# Patient Record
Sex: Male | Born: 1940 | Race: White | Hispanic: No | Marital: Married | Smoking: Current every day smoker
Health system: Southern US, Community
[De-identification: ages and names within clinical notes are randomized; demographics above are authoritative.]

## PROBLEM LIST (undated history)

## (undated) DIAGNOSIS — L309 Dermatitis, unspecified: Secondary | ICD-10-CM

## (undated) DIAGNOSIS — M79605 Pain in left leg: Secondary | ICD-10-CM

## (undated) DIAGNOSIS — F419 Anxiety disorder, unspecified: Secondary | ICD-10-CM

## (undated) DIAGNOSIS — M79604 Pain in right leg: Secondary | ICD-10-CM

## (undated) DIAGNOSIS — R011 Cardiac murmur, unspecified: Secondary | ICD-10-CM

## (undated) DIAGNOSIS — N390 Urinary tract infection, site not specified: Secondary | ICD-10-CM

## (undated) DIAGNOSIS — I10 Essential (primary) hypertension: Secondary | ICD-10-CM

## (undated) DIAGNOSIS — E785 Hyperlipidemia, unspecified: Secondary | ICD-10-CM

## (undated) HISTORY — DX: Pain in left leg: M79.605

## (undated) HISTORY — DX: Cardiac murmur, unspecified: R01.1

## (undated) HISTORY — DX: Dermatitis, unspecified: L30.9

## (undated) HISTORY — DX: Hyperlipidemia, unspecified: E78.5

## (undated) HISTORY — DX: Pain in right leg: M79.604

## (undated) HISTORY — DX: Anxiety disorder, unspecified: F41.9

## (undated) HISTORY — DX: Urinary tract infection, site not specified: N39.0

## (undated) HISTORY — DX: Essential (primary) hypertension: I10

---
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
## 2006-09-04 ENCOUNTER — Emergency Department (HOSPITAL_COMMUNITY): Admission: EM | Admit: 2006-09-04 | Discharge: 2006-09-04 | Payer: Self-pay | Admitting: Family Medicine

## 2007-04-08 ENCOUNTER — Encounter: Payer: Self-pay | Admitting: Internal Medicine

## 2007-10-01 ENCOUNTER — Emergency Department (HOSPITAL_COMMUNITY): Admission: EM | Admit: 2007-10-01 | Discharge: 2007-10-01 | Payer: Self-pay | Admitting: Family Medicine

## 2008-05-15 ENCOUNTER — Ambulatory Visit: Payer: Self-pay | Admitting: Internal Medicine

## 2008-05-15 DIAGNOSIS — I10 Essential (primary) hypertension: Secondary | ICD-10-CM | POA: Insufficient documentation

## 2008-05-15 DIAGNOSIS — L259 Unspecified contact dermatitis, unspecified cause: Secondary | ICD-10-CM

## 2008-05-15 DIAGNOSIS — E785 Hyperlipidemia, unspecified: Secondary | ICD-10-CM | POA: Insufficient documentation

## 2008-05-15 DIAGNOSIS — J309 Allergic rhinitis, unspecified: Secondary | ICD-10-CM | POA: Insufficient documentation

## 2008-06-12 ENCOUNTER — Telehealth: Payer: Self-pay | Admitting: Internal Medicine

## 2008-06-18 ENCOUNTER — Telehealth: Payer: Self-pay | Admitting: Internal Medicine

## 2008-07-23 ENCOUNTER — Ambulatory Visit: Payer: Self-pay | Admitting: Internal Medicine

## 2008-07-23 ENCOUNTER — Telehealth: Payer: Self-pay | Admitting: Internal Medicine

## 2008-10-04 ENCOUNTER — Telehealth: Payer: Self-pay | Admitting: Internal Medicine

## 2009-01-18 ENCOUNTER — Telehealth (INDEPENDENT_AMBULATORY_CARE_PROVIDER_SITE_OTHER): Payer: Self-pay | Admitting: *Deleted

## 2009-03-27 ENCOUNTER — Telehealth (INDEPENDENT_AMBULATORY_CARE_PROVIDER_SITE_OTHER): Payer: Self-pay | Admitting: *Deleted

## 2009-03-28 ENCOUNTER — Ambulatory Visit: Payer: Self-pay | Admitting: Internal Medicine

## 2009-04-23 ENCOUNTER — Telehealth: Payer: Self-pay | Admitting: Internal Medicine

## 2009-10-21 ENCOUNTER — Telehealth: Payer: Self-pay | Admitting: Internal Medicine

## 2009-12-22 HISTORY — PX: COLONOSCOPY: SHX174

## 2010-04-24 ENCOUNTER — Telehealth: Payer: Self-pay | Admitting: Internal Medicine

## 2010-05-22 ENCOUNTER — Telehealth: Payer: Self-pay | Admitting: Internal Medicine

## 2010-06-09 ENCOUNTER — Telehealth: Payer: Self-pay | Admitting: Internal Medicine

## 2010-07-07 ENCOUNTER — Ambulatory Visit: Payer: Self-pay | Admitting: Internal Medicine

## 2010-07-07 DIAGNOSIS — F411 Generalized anxiety disorder: Secondary | ICD-10-CM | POA: Insufficient documentation

## 2010-08-19 ENCOUNTER — Telehealth: Payer: Self-pay | Admitting: Internal Medicine

## 2010-09-23 NOTE — Progress Notes (Signed)
Summary: refill  Phone Note Refill Request Message from:  Pharmacy  Refills Requested: Medication #1:  TEMAZEPAM 15 MG CAPS Take 1 tablet by mouth at bedtime [facility]      Prescriptions: TEMAZEPAM 15 MG CAPS (TEMAZEPAM) Take 1 tablet by mouth at bedtime  #60 x 3   Entered by:   Duard Brady LPN   Authorized by:   Gordy Savers  MD   Signed by:   Duard Brady LPN on 16/05/9603   Method used:   Historical   RxID:   [id number]

## 2010-09-23 NOTE — Progress Notes (Signed)
Summary: refill hydromet  Phone Note Refill Request Message from:  Fax from Pharmacy on April 24, 2010 9:11 AM  Refills Requested: Medication #1:  HYDROCODONE-HOMATROPINE 5-1.5 MG/5ML SYRP 1 teaspoon every 6 hours as needed for cough.   Last Refilled: 12/12/2009 [facility]   [phone]   Method Requested: Telephone to Pharmacy Initial call taken by: Duard Brady LPN,  April 24, 2010 9:11 AM  Follow-up for Phone Call        6 oz Follow-up by: Gordy Savers  MD,  April 24, 2010 12:40 PM    Prescriptions: Romana Juniper 5-1.5 MG/5ML SYRP (HYDROCODONE-HOMATROPINE) 1 teaspoon every 6 hours as needed for cough  #6 ounces x 0   Entered by:   Duard Brady LPN   Authorized by:   Gordy Savers  MD   Signed by:   Duard Brady LPN on 78/46/9629   Method used:   Historical   RxID:   [id number]  [facility]   KIK

## 2010-09-23 NOTE — Progress Notes (Signed)
Summary: refill temazepam   Phone Note Refill Request Message from:  Fax from Pharmacy on June 09, 2010 12:00 PM  Refills Requested: Medication #1:  TEMAZEPAM 15 MG CAPS Take 1 tablet by mouth at bedtime   Last Refilled: 04/07/2010 [facility]      Method Requested: Fax to Local Pharmacy Initial call taken by: Duard Brady LPN,  June 09, 2010 12:01 PM    Prescriptions: TEMAZEPAM 15 MG CAPS (TEMAZEPAM) Take 1 tablet by mouth at bedtime  #15 x 0   Entered by:   Duard Brady LPN   Authorized by:   Gordy Savers  MD   Signed by:   Duard Brady LPN on 16/05/9603   Method used:   Historical   RxID:   [id number]  [facility]ed [facility] to [facility] - MUST BE SEEN _ LAST SEEN 03/2009      KIK

## 2010-09-23 NOTE — Progress Notes (Signed)
Summary: refill hydromet declined  Phone Note Refill Request Message from:  Fax from Pharmacy on May 22, 2010 10:40 AM  Refills Requested: Medication #1:  HYDROCODONE-HOMATROPINE 5-1.5 MG/5ML SYRP 1 teaspoon every 6 hours as needed for cough.   Last Refilled: 04/24/2010 [facility]   Method Requested: Fax to Local Pharmacy Initial call taken by: Duard Brady LPN,  May 22, 2010 10:41 AM  Follow-up for Phone Call        has patient stopped lisinopril; no further Rx for cough med- try OTC delsym; ROV if unimproved Follow-up by: Gordy Savers  MD,  May 22, 2010 2:47 PM  Additional Follow-up for Phone Call Additional follow up Details #1::        spoke with pt - stopped lisinopril awhile ago - using some tpte of 'watermelon coumpound' that he purchased off the internet - seems to be controling BP .  Informed to use OTC delsym - has been using otc - worried about effect on BP - offered appt - declines at this time will let us know if he needs to be seen. KIK Additional Follow-up by: Duard Brady LPN,  May 22, 2010 2:59 PM

## 2010-09-23 NOTE — Assessment & Plan Note (Signed)
Summary: fu on meds/?sinus inf/njr   Vital Signs:  Patient profile:   70 year old male Weight:      204 pounds Temp:     98.0 degrees F oral BP sitting:   170 / 100  (right arm) Cuff size:   regular  Vitals Entered By: Duard Brady LPN (July 07, 2010 3:25 PM) CC: medication review - BP concerns  - sinus infection Is Patient Diabetic? No   CC:  medication review - BP concerns  - sinus infection.  History of Present Illness: 70 year old patient who is followed in the [place] and has been on alprazolam 1 mg t.i.d. since May of this year.  He also had a colonoscopy at that time.  He states that he was placed on a 24-hour blood pressure monitor and his and blood pressure medications have been discontinued.  He continues to monitor home blood pressure readings with normal results in the morning.  Blood pressure readings throughout the day  are  occasionally labile.  He is on simvastatin for dyslipidemia.  He has a history of allergic rhinitis  Allergies (verified): No Known Drug Allergies  Past History:  Past Medical History: Hyperlipidemia Hypertension Allergic rhinitis dermatitis history malaria in 1970 to 1982 Anxiety  Past Surgical History: Tonsillectomy 1950 colonoscopy May 2011  Family History: Reviewed history from 05/15/2008 and no changes required. father died age 80, unclear causes mother died age 5, breast cancer  Two brothers, one died age 49, renal cancer one brother died age 57, brain cancer  Social History: Reviewed history from 05/15/2008 and no changes required. 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  The patient denies anorexia, fever, weight loss, weight gain, vision loss, decreased hearing, chest pain, syncope, dyspnea on exertion, peripheral edema, prolonged  cough, hemoptysis, abdominal pain, melena, hematochezia, severe indigestion/heartburn, hematuria, incontinence, genital sores, muscle weakness, suspicious skin lesions, transient blindness, difficulty walking, depression, unusual weight change, abnormal bleeding, enlarged lymph nodes, angioedema, breast masses, and testicular masses.    Physical Exam  General:  Well-developed,well-nourished,in no acute distress; alert,appropriate and cooperative throughout examination; 140 or 90 Head:  Normocephalic and atraumatic without obvious abnormalities. No apparent alopecia or balding. Eyes:  No corneal or conjunctival inflammation noted. EOMI. Perrla. Funduscopic exam benign, without hemorrhages, exudates or papilledema. Vision grossly normal. Ears:  External ear exam shows no significant lesions or deformities.  Otoscopic examination reveals clear canals, tympanic membranes are intact bilaterally without bulging, retraction, inflammation or discharge. Hearing is grossly normal bilaterally. Nose:  External nasal examination shows no deformity or inflammation. Nasal mucosa are pink and moist without lesions or exudates. Mouth:  Oral mucosa and oropharynx without lesions or exudates.  Teeth in good repair. Neck:  No deformities, masses, or tenderness noted. Chest Wall:  No deformities, masses, tenderness or gynecomastia noted. Lungs:  Normal respiratory effort, chest expands symmetrically. Lungs are clear to auscultation, no crackles or wheezes. Heart:  Normal rate and regular rhythm. S1 and S2 normal without gallop, murmur, click, rub or other extra sounds.   Impression & Recommendations:  Problem # 1:  ALLERGIC RHINITIS (ICD-477.9)  His updated medication list for this problem includes:    Zyrtec Allergy 10 Mg Tabs (Cetirizine hcl) ..... Qd    Fluticasone Propionate 50 Mcg/act Susp (Fluticasone propionate) ..... Use daily  His updated medication  list for this problem includes:    Zyrtec Allergy 10  Mg Tabs (Cetirizine hcl) ..... Qd    Fluticasone Propionate 50 Mcg/act Susp (Fluticasone propionate) ..... Use daily  Problem # 2:  HYPERTENSION (ICD-401.9)  The following medications were removed from the medication list:    Bisoprolol-hydrochlorothiazide 5-6.25 Mg Tabs (Bisoprolol-hydrochlorothiazide) ..... One daily presently  controlled off medications  The following medications were removed from the medication list:    Bisoprolol-hydrochlorothiazide 5-6.25 Mg Tabs (Bisoprolol-hydrochlorothiazide) ..... One daily  Problem # 3:  HYPERLIPIDEMIA (ICD-272.4)  The following medications were removed from the medication list:    Simvastatin 20 Mg Tabs (Simvastatin) .Marland Kitchen... 1 once daily His updated medication list for this problem includes:    Simvastatin 40 Mg Tabs (Simvastatin) ..... One daily  The following medications were removed from the medication list:    Simvastatin 20 Mg Tabs (Simvastatin) .Marland Kitchen... 1 once daily His updated medication list for this problem includes:    Simvastatin 40 Mg Tabs (Simvastatin) ..... One daily  Problem # 4:  ANXIETY (ICD-300.00)  His updated medication list for this problem includes:    Alprazolam 1 Mg Tabs (Alprazolam) .Marland Kitchen... Tid  His updated medication list for this problem includes:    Alprazolam 1 Mg Tabs (Alprazolam) .Marland Kitchen... Tid  Complete Medication List: 1)  Temazepam 15 Mg Caps (Temazepam) .... Take 1 tablet by mouth at bedtime 2)  Hydrocodone-homatropine 5-1.5 Mg/13ml Syrp (Hydrocodone-homatropine) .Marland Kitchen.. 1 teaspoon every 6 hours as needed for cough 3)  Viagra 100 Mg Tabs (Sildenafil citrate) .... As directed 4)  Alprazolam 1 Mg Tabs (Alprazolam) .... Tid 5)  Zyrtec Allergy 10 Mg Tabs (Cetirizine hcl) .... Qd 6)  Aspir-low 81 Mg Tbec (Aspirin) .... Qd 7)  Simvastatin 40 Mg Tabs (Simvastatin) .... One daily 8)  Fluticasone Propionate 50 Mcg/act Susp (Fluticasone propionate) .... Use daily  Patient Instructions: 1)  Please schedule a follow-up  appointment in 6 months. 2)  Limit your Sodium (Salt). 3)  It is important that you exercise regularly at least 20 minutes 5 times a week. If you develop chest pain, have severe difficulty breathing, or feel very tired , stop exercising immediately and seek medical attention. 4)  You need to lose weight. Consider a lower calorie diet and regular exercise.  5)  Check your Blood Pressure regularly. If it is above: 150/90  you should make an appointment. Prescriptions: FLUTICASONE PROPIONATE 50 MCG/ACT SUSP (FLUTICASONE PROPIONATE) use daily  #1 x 6   Entered and Authorized by:   Gordy Savers  MD   Signed by:   Gordy Savers  MD on 07/07/2010   Method used:   Print then Give to Patient   RxID:   [id number] SIMVASTATIN 40 MG TABS (SIMVASTATIN) one daily  #90 x 3   Entered and Authorized by:   Gordy Savers  MD   Signed by:   Gordy Savers  MD on 07/07/2010   Method used:   Print then Give to Patient   RxID:   [id number] ALPRAZOLAM 1 MG TABS (ALPRAZOLAM) tid  #90 x 3   Entered and Authorized by:   Gordy Savers  MD   Signed by:   Gordy Savers  MD on 07/07/2010   Method used:   Print then Give to Patient   RxID:   [id number] VIAGRA 100 MG TABS (SILDENAFIL CITRATE) as directed  #6 x 6   Entered and Authorized by:   Gordy Savers  MD  Signed by:   Gordy Savers  MD on 07/07/2010   Method used:   Print then Give to Patient   RxID:   [id number] HYDROCODONE-HOMATROPINE 5-1.5 MG/5ML SYRP (HYDROCODONE-HOMATROPINE) 1 teaspoon every 6 hours as needed for cough  #6 ounces x 0   Entered and Authorized by:   Gordy Savers  MD   Signed by:   Gordy Savers  MD on 07/07/2010   Method used:   Print then Give to Patient   RxID:   [id number] TEMAZEPAM 15 MG CAPS (TEMAZEPAM) Take 1 tablet by mouth at bedtime  #30 x 3   Entered and Authorized by:   Gordy Savers  MD   Signed by:   Gordy Savers  MD on  07/07/2010   Method used:   Print then Give to Patient   RxID:   [id number]    Orders Added: 1)  Est. Patient Level III [69678]

## 2010-09-25 NOTE — Progress Notes (Signed)
Summary: refill hydromet  Phone Note Refill Request Message from:  Fax from Pharmacy on August 19, 2010 9:26 AM  Refills Requested: Medication #1:  HYDROCODONE-HOMATROPINE 5-1.5 MG/5ML SYRP 1 teaspoon every 6 hours as needed for cough   Last Refilled: 07/08/2010 [facility]   Method Requested: Fax to Local Pharmacy Initial call taken by: Duard Brady LPN,  August 19, 2010 9:27 AM  Follow-up for Phone Call        6 oz Follow-up by: Gordy Savers  MD,  August 19, 2010 12:32 PM    Prescriptions: Romana Juniper 5-1.5 MG/5ML SYRP (HYDROCODONE-HOMATROPINE) 1 teaspoon every 6 hours as needed for cough  #6 ounces x 0   Entered by:   Duard Brady LPN   Authorized by:   Gordy Savers  MD   Signed by:   Duard Brady LPN on 16/05/9603   Method used:   Historical   RxID:   [id number]  faxed back to pharmacy   kik

## 2010-10-17 ENCOUNTER — Other Ambulatory Visit: Payer: Self-pay

## 2010-10-17 MED ORDER — HYDROCODONE-HOMATROPINE 5-1.5 MG/5ML PO SYRP: 5.0000 mL | ORAL_SOLUTION | Freq: Four times a day (QID) | ORAL | Status: AC | PRN

## 2010-10-17 NOTE — Telephone Encounter (Signed)
Faxed back to [facility]. kik

## 2010-10-22 ENCOUNTER — Telehealth: Payer: Self-pay

## 2010-10-22 DIAGNOSIS — G4700 Insomnia, unspecified: Secondary | ICD-10-CM

## 2010-10-22 MED ORDER — TEMAZEPAM 15 MG PO CAPS: 30.0000 mg | ORAL_CAPSULE | Freq: Every evening | ORAL | Status: DC | PRN

## 2010-10-22 NOTE — Telephone Encounter (Signed)
Pt is requesting temazepam rx to read 1 or 2 qhs prn . States he has to occasional take 2 when he feels the 1 qhs has not worked . States he discussed at last office visit. Please advise   [facility]

## 2010-10-22 NOTE — Telephone Encounter (Signed)
Change to med list - order called in to pharmacy. KIK

## 2010-10-22 NOTE — Telephone Encounter (Signed)
Okay if the patient takes one or 2 at bedtime as needed if the prescription is for  15 mg. Maximum dose is 30 mg at bedtime

## 2011-01-12 ENCOUNTER — Other Ambulatory Visit: Payer: Self-pay

## 2011-01-12 MED ORDER — ALPRAZOLAM 1 MG PO TABS: 1.0000 mg | ORAL_TABLET | Freq: Three times a day (TID) | ORAL | Status: DC

## 2011-01-12 NOTE — Telephone Encounter (Signed)
Faxed back to [facility].

## 2011-01-16 ENCOUNTER — Other Ambulatory Visit: Payer: Self-pay

## 2011-01-16 MED ORDER — TEMAZEPAM 15 MG PO CAPS: ORAL_CAPSULE | ORAL | Status: DC

## 2011-01-16 NOTE — Telephone Encounter (Signed)
F[facility]ed [facility] to [facility] 

## 2011-03-26 ENCOUNTER — Other Ambulatory Visit: Payer: Self-pay | Admitting: *Deleted

## 2011-03-26 MED ORDER — TEMAZEPAM 15 MG PO CAPS: ORAL_CAPSULE | ORAL | Status: DC

## 2011-04-13 ENCOUNTER — Other Ambulatory Visit: Payer: Self-pay | Admitting: Internal Medicine

## 2011-08-09 ENCOUNTER — Other Ambulatory Visit: Payer: Self-pay | Admitting: Internal Medicine

## 2011-08-10 ENCOUNTER — Telehealth: Payer: Self-pay

## 2011-08-10 ENCOUNTER — Other Ambulatory Visit: Payer: Self-pay | Admitting: Internal Medicine

## 2011-08-10 NOTE — Telephone Encounter (Signed)
Opened in error

## 2011-09-22 ENCOUNTER — Other Ambulatory Visit: Payer: Self-pay | Admitting: Internal Medicine

## 2011-09-23 NOTE — Telephone Encounter (Signed)
Pt last seen 07/07/2010. Rx last filled 08/10/2011.  Pls advise.

## 2011-09-24 NOTE — Telephone Encounter (Signed)
60

## 2011-09-25 ENCOUNTER — Telehealth: Payer: Self-pay | Admitting: Internal Medicine

## 2011-09-25 NOTE — Telephone Encounter (Signed)
ok 

## 2011-09-25 NOTE — Telephone Encounter (Signed)
Pls advise.  

## 2011-09-25 NOTE — Telephone Encounter (Signed)
Pt has sch ov for 09/30/11 and is req 8 pills to last until ov. Pls call in to [facility] on [address] today prior to leaving office. Pt only has enough med for tonight.

## 2011-09-28 NOTE — Telephone Encounter (Signed)
Rx picked up at pharmacy

## 2011-09-30 ENCOUNTER — Encounter: Payer: Self-pay | Admitting: Internal Medicine

## 2011-10-22 NOTE — Progress Notes (Signed)
This encounter was created in error - please disregard.

## 2011-10-28 ENCOUNTER — Encounter: Payer: Self-pay | Admitting: Family Medicine

## 2011-10-28 ENCOUNTER — Ambulatory Visit (INDEPENDENT_AMBULATORY_CARE_PROVIDER_SITE_OTHER): Payer: Self-pay | Admitting: Family Medicine

## 2011-10-28 DIAGNOSIS — F1010 Alcohol abuse, uncomplicated: Secondary | ICD-10-CM

## 2011-10-28 DIAGNOSIS — I10 Essential (primary) hypertension: Secondary | ICD-10-CM

## 2011-10-28 DIAGNOSIS — F411 Generalized anxiety disorder: Secondary | ICD-10-CM

## 2011-10-28 DIAGNOSIS — F4310 Post-traumatic stress disorder, unspecified: Secondary | ICD-10-CM

## 2011-10-28 DIAGNOSIS — G4700 Insomnia, unspecified: Secondary | ICD-10-CM

## 2011-10-28 DIAGNOSIS — Z720 Tobacco use: Secondary | ICD-10-CM

## 2011-10-28 DIAGNOSIS — E785 Hyperlipidemia, unspecified: Secondary | ICD-10-CM

## 2011-10-28 DIAGNOSIS — F17200 Nicotine dependence, unspecified, uncomplicated: Secondary | ICD-10-CM

## 2011-10-28 MED ORDER — METOPROLOL SUCCINATE ER 50 MG PO TB24: 50.0000 mg | ORAL_TABLET | Freq: Every day | ORAL | Status: DC

## 2011-10-28 NOTE — Patient Instructions (Signed)
It was great to see you today!  Schedule an appointment to see me in one week.  

## 2011-10-29 LAB — COMPREHENSIVE METABOLIC PANEL
ALT: 14 U/L (ref 0–53)
AST: 36 U/L (ref 0–37)
Alkaline Phosphatase: 121 U/L — ABNORMAL HIGH (ref 39–117)
BUN: 10 mg/dL (ref 6–23)
Calcium: 9.2 mg/dL (ref 8.4–10.5)
Chloride: 102 mEq/L (ref 96–112)
Creat: 0.9 mg/dL (ref 0.50–1.35)
Total Bilirubin: 0.5 mg/dL (ref 0.3–1.2)

## 2011-10-29 LAB — LIPID PANEL
HDL: 62 mg/dL (ref >39)
LDL Cholesterol: 129 mg/dL — ABNORMAL HIGH (ref 0–99)

## 2011-10-30 DIAGNOSIS — Z720 Tobacco use: Secondary | ICD-10-CM | POA: Insufficient documentation

## 2011-10-30 DIAGNOSIS — G4700 Insomnia, unspecified: Secondary | ICD-10-CM | POA: Insufficient documentation

## 2011-10-30 DIAGNOSIS — F1010 Alcohol abuse, uncomplicated: Secondary | ICD-10-CM | POA: Insufficient documentation

## 2011-10-30 DIAGNOSIS — F4310 Post-traumatic stress disorder, unspecified: Secondary | ICD-10-CM | POA: Insufficient documentation

## 2011-10-30 NOTE — Assessment & Plan Note (Signed)
Currently using temazepam. I believe this is related to anxiety/alcohol use/poor coping mechanisms.  I will revisit this at his next week appointment

## 2011-10-30 NOTE — Assessment & Plan Note (Signed)
There is more to this than he lets on about. I will slowly peel his denial back and treat this once he is open about it.

## 2011-10-30 NOTE — Assessment & Plan Note (Signed)
This is tied closely to his anxiety, but I do not know how much of this is an excuse for why he is anxious, which likely stems primarily from alcohol addiction and abuse.

## 2011-10-30 NOTE — Assessment & Plan Note (Signed)
I will revisit this at his next week appointment
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
## 2011-10-30 NOTE — Assessment & Plan Note (Signed)
Very elevated pressures. I have started him on Toprol -xl and will do a HTN check in a week. This will help with his adrenergic symptoms as well. I think he will need multiple agents.

## 2011-10-30 NOTE — Assessment & Plan Note (Signed)
Advised to quit. He desires to quit. He is trying electric cigarettes. I will discuss using wellbutrin/nicotine gum/chantix next visit.

## 2011-11-04 ENCOUNTER — Ambulatory Visit (INDEPENDENT_AMBULATORY_CARE_PROVIDER_SITE_OTHER): Payer: Self-pay | Admitting: Family Medicine

## 2011-11-04 ENCOUNTER — Encounter: Payer: Self-pay | Admitting: Family Medicine

## 2011-11-04 VITALS — BP 164/84 | HR 68 | Temp 98.6°F | Ht 70.0 in | Wt 212.5 lb

## 2011-11-04 DIAGNOSIS — F17200 Nicotine dependence, unspecified, uncomplicated: Secondary | ICD-10-CM

## 2011-11-04 DIAGNOSIS — F411 Generalized anxiety disorder: Secondary | ICD-10-CM

## 2011-11-04 DIAGNOSIS — F1010 Alcohol abuse, uncomplicated: Secondary | ICD-10-CM

## 2011-11-04 DIAGNOSIS — G4700 Insomnia, unspecified: Secondary | ICD-10-CM

## 2011-11-04 DIAGNOSIS — Z720 Tobacco use: Secondary | ICD-10-CM

## 2011-11-04 DIAGNOSIS — E785 Hyperlipidemia, unspecified: Secondary | ICD-10-CM

## 2011-11-04 DIAGNOSIS — F4310 Post-traumatic stress disorder, unspecified: Secondary | ICD-10-CM

## 2011-11-04 DIAGNOSIS — I10 Essential (primary) hypertension: Secondary | ICD-10-CM

## 2011-11-04 MED ORDER — ROSUVASTATIN CALCIUM 10 MG PO TABS: 10.0000 mg | ORAL_TABLET | Freq: Every day | ORAL | Status: DC

## 2011-11-04 MED ORDER — HYDROXYZINE HCL 10 MG PO TABS: 10.0000 mg | ORAL_TABLET | Freq: Every evening | ORAL | Status: AC | PRN

## 2011-11-04 MED ORDER — VENLAFAXINE HCL ER 37.5 MG PO CP24: 37.5000 mg | ORAL_CAPSULE | Freq: Every day | ORAL | Status: DC

## 2011-11-04 MED ORDER — AMLODIPINE BESYLATE 10 MG PO TABS: 10.0000 mg | ORAL_TABLET | Freq: Every day | ORAL | Status: DC

## 2011-11-04 NOTE — Patient Instructions (Addendum)
It was great to see you today!  Schedule an appointment to see me in one week.  I have started a new blood pressure medication: amlodipine. Continue to chart your blood pressures, that helps a lot.  I started you on hydroxyzine 10 mg, you can take two tabs for sleep.  I have stopped your temazepam and alprazolam.  The amount of alcohol you drink is safe and healthy.   I have prescribed you Venlafaxine 37 mg daily and we will increase this in one week. This is for your PTSD.  I think a therapist would help you, but this is up to you of course.  See you then.

## 2011-11-05 NOTE — Assessment & Plan Note (Signed)
I have added Amlodipine to his medications. His blood pressure is improving since starting metoprolol. No side effects.

## 2011-11-05 NOTE — Assessment & Plan Note (Signed)
Advised to quit. Says he has cut down to four cigarettes per day and he will continue to cut down. Rec. Gum/patch.

## 2011-11-05 NOTE — Progress Notes (Signed)
  Subjective:    Patient ID: Eric Hoffman, male    DOB: 19-May-1941, 71 y.o.   MRN: [id number]  HPI NEW PATIENT 1. HTN BP Readings from Last 3 Encounters:  11/04/11 164/84  10/28/11 206/99  07/07/10 170/100  His pressure is improving since adding Metoprolol.   Home BP Monitoring brought a well written log showing lower averages than at [facility] 140/90. Chest pain- no     Dyspnea-  no  Medications Compliance: taking as prescribed. Lightheadedness-  no  Edema-  no  No headache, or visual changes. No palpitations.  Lab Review   Potassium  Date Value Range Status  10/28/2011 4.7  3.5-5.3 (mEq/L) Final     Sodium  Date Value Range Status  10/28/2011 140  135-145 (mEq/L) Final    2. SLEEP Has been using temazepam with little success. He has stopped taking xanax during the day.   3. Lipid profile - side effects with simvastatin 40 mg.  Suffering from muscle pains and cramps.   Lab Results  Component Value Date   CHOL 210* 10/28/2011   Lab Results  Component Value Date   HDL 62 10/28/2011   Lab Results  Component Value Date   LDLCALC 129* 10/28/2011   Lab Results  Component Value Date   TRIG 96 10/28/2011   Lab Results  Component Value Date   CHOLHDL 3.4 10/28/2011   No results found for this basename: LDLDIRECT   4. Anxiety/PTSD He has had many traumatic events as reviewed in last visits note. We did anxiety inventory/mood disorder questionnaire/PTSD questionnaire. He scored moderate on anxiety, low-moderate on mood, and very high on PTSD.  He says he was in a psychiatric institute in [place] for 7 weeks many years ago, was fuzzy on the details of diagnosis and treatment.  He denies suicidal/homicial thoughts. No overt mania, although in my office he can appear manic especially when accompanied by his wife. Today he is calm.   6. Alcohol consumption CAGE questions negative - I felt that he may be in denial vs. Purposefully hiding his alcohol use level. His wife who is a recovering  alcoholic gave me a letter stating she is worried about his level of alcohol use. On further review of his alcohol consumption he states he drinks 2 glasses of wine a day with meals. He says he can go without drinking. He never drinks excessively. He does not drink liquor or beer. Never drinks in the morning. Never drives intoxicated.   Review of Systems Pertinent items are noted in HPI. No fever, chills, night sweats, weight loss.     Objective:   Physical Exam Filed Vitals:   11/04/11 1455  BP: 164/84  Pulse: 68  Temp: 98.6 F (37 C)  TempSrc: Oral  Height: 5\' 10"  (1.778 m)  Weight: 212 lb 8 oz (96.389 kg)  General: C/m, calm, coherent, nad, pleasant.  Psych:  Cognition and judgment appear intact. Alert, communicative  and cooperative with normal attention span and concentration. No apparent delusions, illusions, hallucinations    Assessment & Plan:

## 2011-11-05 NOTE — Assessment & Plan Note (Signed)
Side effects to simvastatin 40 mg.  His LDL is not at goal either. I want him less than 100 at least, 70 ideal. His HDL is excellent 62, likely 2nd to fish consumption/fish oil supplements.

## 2011-11-05 NOTE — Assessment & Plan Note (Signed)
His anxiety inventory was moderate and I feel this is related to PTSD, GAD, or underlying mood disorder. I have started venlafaxine and removed benzodiazepines. He denies substance abuse.

## 2011-11-05 NOTE — Assessment & Plan Note (Signed)
Hydroxyzine 10 mg prn insomnia. This is likely related to his anxiety state.

## 2011-11-05 NOTE — Assessment & Plan Note (Signed)
Denies alcohol abuse again. He is drinking two glasses of wine a day, which is not abuse.

## 2011-11-05 NOTE — Assessment & Plan Note (Signed)
Scored high on the PTSD assessment. I have started Venlafaxine 37.2 daily, will see again in a week to evaluate. I advised him that this could potentiate mania - I will keep a close eye on this next visit.

## 2011-11-18 ENCOUNTER — Ambulatory Visit (INDEPENDENT_AMBULATORY_CARE_PROVIDER_SITE_OTHER): Payer: Self-pay | Admitting: Family Medicine

## 2011-11-18 ENCOUNTER — Encounter: Payer: Self-pay | Admitting: Family Medicine

## 2011-11-18 VITALS — BP 170/102 | HR 88 | Temp 99.1°F | Ht 70.0 in | Wt 207.0 lb

## 2011-11-18 DIAGNOSIS — F411 Generalized anxiety disorder: Secondary | ICD-10-CM

## 2011-11-18 DIAGNOSIS — I10 Essential (primary) hypertension: Secondary | ICD-10-CM

## 2011-11-18 MED ORDER — SIMVASTATIN 40 MG PO TABS: 40.0000 mg | ORAL_TABLET | Freq: Every evening | ORAL | Status: DC

## 2011-11-18 MED ORDER — LOSARTAN POTASSIUM 25 MG PO TABS: 50.0000 mg | ORAL_TABLET | Freq: Every day | ORAL | Status: DC

## 2011-11-18 NOTE — Progress Notes (Signed)
Addended by: Edd Arbour on: 11/18/2011 02:31 PM   Modules accepted: Orders

## 2011-11-18 NOTE — Progress Notes (Signed)
  Subjective:    Patient ID: Eric Hoffman, male    DOB: 1940-10-14, 71 y.o.   MRN: [id number]  Hypertension   1. HTN BP Readings from Last 3 Encounters:  11/18/11 170/102  11/04/11 164/84  10/28/11 206/99  His pressure is improving since adding Metoprolol.  He could not tolerate the Amlodipine, but this reaction may have been the effexor.   Home BP Monitoring brought a well written log showing lower averages than at [facility] 140/90. Chest pain- no     Dyspnea-  no  Medications Compliance: taking as prescribed. Lightheadedness-  no  Edema-  no  No headache, or visual changes. No palpitations.  Lab Review   Potassium  Date Value Range Status  10/28/2011 4.7  3.5-5.3 (mEq/L) Final     Sodium  Date Value Range Status  10/28/2011 140  135-145 (mEq/L) Final    4. Anxiety/PTSD Attempted to start effexor, but he had nightmares, diarrhea, throat closing, tremors. He does not want to take this medication.  He denies suicidal/homicial thoughts. No overt mania, although in my office he can appear manic especially when accompanied by his wife. Today he is calm.  He says he is under a lot of stress because his wife has binged recently and may be back in the hospital soon.   Review of Systems Pertinent items are noted in HPI.     Objective:   Physical Exam Filed Vitals:   11/18/11 1342  BP: 170/102  Pulse: 88  Temp: 99.1 F (37.3 C)  TempSrc: Oral  Height: 5\' 10"  (1.778 m)  Weight: 207 lb (93.895 kg)  General: C/m, calm, coherent, nad, pleasant.  Psych:  Cognition and judgment appear intact. Alert, communicative  and cooperative with normal attention span and concentration. No apparent delusions, illusions, hallucinations    Assessment & Plan:

## 2011-11-18 NOTE — Assessment & Plan Note (Addendum)
Still elevated today. He was not able to take the amlodipine 5 mg because of side effects.  I will have him try to take the amlodipine again, if he still has side effects he will pick up losartan below.  I will try Losartan 50 mg daily to see if this will help. Of note his home recordings are averaging 140/90. When he is here at the office he gets nervous.

## 2011-11-18 NOTE — Assessment & Plan Note (Signed)
The patient stopped taking effexor because of side effects. I will hold off on treating his anxiety until his BP is controlled. We want to introduce one medication at a time.  I do not want to use benzos in this male.

## 2011-11-18 NOTE — Patient Instructions (Addendum)
It was great to see you today!  Schedule an appointment to see me in one week.  I want you to try and take the Amlodipine 5 mg again, I believe your reaction came from the effexor which we have stopped.  Continue taking metoprolol 50 mg XL daily.  I have sent in Losartan 50 mg daily, take this if you cannot tolerate the amlodipine.

## 2011-11-25 ENCOUNTER — Ambulatory Visit (INDEPENDENT_AMBULATORY_CARE_PROVIDER_SITE_OTHER): Payer: Self-pay | Admitting: Family Medicine

## 2011-11-25 ENCOUNTER — Encounter: Payer: Self-pay | Admitting: Family Medicine

## 2011-11-25 VITALS — BP 130/80 | HR 93 | Temp 97.8°F | Ht 70.0 in | Wt 203.4 lb

## 2011-11-25 DIAGNOSIS — I10 Essential (primary) hypertension: Secondary | ICD-10-CM

## 2011-11-25 DIAGNOSIS — G4700 Insomnia, unspecified: Secondary | ICD-10-CM

## 2011-11-25 MED ORDER — TEMAZEPAM 15 MG PO CAPS: 15.0000 mg | ORAL_CAPSULE | Freq: Every evening | ORAL | Status: DC | PRN

## 2011-11-25 NOTE — Assessment & Plan Note (Signed)
Doing well on current regimen. Home monitoring shows average of 140/80.

## 2011-11-25 NOTE — Assessment & Plan Note (Signed)
Will start temazepam. Had allergic reaction to atarax, and benadryl.

## 2011-11-25 NOTE — Progress Notes (Signed)
  Subjective:    Patient ID: Eric Hoffman, male    DOB: 02/12/1941, 71 y.o.   MRN: [id number]  Hypertension   1. HTN BP Readings from Last 3 Encounters:  11/25/11 130/80  11/18/11 170/102  11/04/11 164/84  His pressure is improved today.   Home BP Monitoring brought a well written log showing lower averages than at [facility] 140/90. Chest pain- no     Dyspnea-  no  Medications Compliance: taking as prescribed. Lightheadedness-  no  Edema-  no  No headache, or visual changes. No palpitations.  Lab Review   Potassium  Date Value Range Status  10/28/2011 4.7  3.5-5.3 (mEq/L) Final     Sodium  Date Value Range Status  10/28/2011 140  135-145 (mEq/L) Final    2. Insomnia Patient did well on temazepam. He has good sleep hygiene. He has difficulty falling asleep. No respiratory or GERD symptoms. Denies excess alcohol usage.  Review of Systems Pertinent items are noted in HPI.     Objective:   Physical Exam Filed Vitals:   11/25/11 1340  BP: 130/80  Pulse: 93  Temp: 97.8 F (36.6 C)  TempSrc: Oral  Height: 5\' 10"  (1.778 m)  Weight: 203 lb 6.4 oz (92.262 kg)  General: C/m, calm, coherent, nad, pleasant.  Psych:  Cognition and judgment appear intact. Alert, communicative  and cooperative with normal attention span and concentration. No apparent delusions, illusions, hallucinations    Assessment & Plan:

## 2011-11-25 NOTE — Patient Instructions (Signed)
It was great to see you today!  Schedule an appointment to see me as needed.  I have prescribed you temazepam with 5 refills for sleep.  I am glad your blood pressure is well controlled now on these medications.  It was nice to see you today, if you ever want to address your anxiety either with therapy or medically, let me know.

## 2011-12-23 ENCOUNTER — Telehealth: Payer: Self-pay | Admitting: Family Medicine

## 2011-12-23 DIAGNOSIS — G4700 Insomnia, unspecified: Secondary | ICD-10-CM

## 2011-12-23 NOTE — Telephone Encounter (Signed)
Is asking to increase tomazapam to 2 pills per night - sometimes he needs more that other nights and is asking to increase to 2 pills [facility] [address]

## 2011-12-28 MED ORDER — TEMAZEPAM 15 MG PO CAPS: 30.0000 mg | ORAL_CAPSULE | Freq: Every evening | ORAL | Status: DC | PRN

## 2011-12-28 NOTE — Telephone Encounter (Signed)
Called in refill  

## 2011-12-28 NOTE — Telephone Encounter (Signed)
Called and informed patient of below.

## 2012-01-12 ENCOUNTER — Other Ambulatory Visit: Payer: Self-pay | Admitting: Family Medicine

## 2012-01-12 MED ORDER — SILDENAFIL CITRATE 100 MG PO TABS: 100.0000 mg | ORAL_TABLET | ORAL | Status: DC

## 2012-01-12 NOTE — Telephone Encounter (Signed)
Message forwarded to Dr. Rivka Safer.

## 2012-01-12 NOTE — Telephone Encounter (Signed)
Forgot to tell Dr Rivka Safer that he needed a refill on his Viagra  [facility]- [address]

## 2012-01-13 ENCOUNTER — Other Ambulatory Visit: Payer: Self-pay | Admitting: Family Medicine

## 2012-01-13 MED ORDER — LOSARTAN POTASSIUM 25 MG PO TABS: 50.0000 mg | ORAL_TABLET | Freq: Every day | ORAL | Status: DC

## 2012-03-11 ENCOUNTER — Encounter: Payer: Self-pay | Admitting: Family Medicine

## 2012-03-11 ENCOUNTER — Ambulatory Visit (INDEPENDENT_AMBULATORY_CARE_PROVIDER_SITE_OTHER): Payer: Self-pay | Admitting: Family Medicine

## 2012-03-11 VITALS — BP 185/99 | HR 86 | Temp 98.0°F | Ht 70.0 in | Wt 211.0 lb

## 2012-03-11 DIAGNOSIS — R300 Dysuria: Secondary | ICD-10-CM | POA: Insufficient documentation

## 2012-03-11 DIAGNOSIS — I10 Essential (primary) hypertension: Secondary | ICD-10-CM

## 2012-03-11 LAB — POCT URINALYSIS DIPSTICK
Bilirubin, UA: NEGATIVE
Glucose, UA: NEGATIVE
Leukocytes, UA: NEGATIVE
Nitrite, UA: NEGATIVE
Urobilinogen, UA: 0.2

## 2012-03-11 MED ORDER — SULFAMETHOXAZOLE-TRIMETHOPRIM 800-160 MG PO TABS: 1.0000 | ORAL_TABLET | Freq: Two times a day (BID) | ORAL | Status: AC

## 2012-03-11 NOTE — Assessment & Plan Note (Signed)
Significant difference in home and office readings.  Advised may consider bringing home monitors home to compare to office readings at next visit.

## 2012-03-11 NOTE — Patient Instructions (Addendum)
Come back for full testing and exam if not resolved

## 2012-03-11 NOTE — Progress Notes (Signed)
  Subjective:    Patient ID: Eric Hoffman, male    DOB: August 08, 1941, 71 y.o.   MRN: [id number]  HPI Has occasional uti 1-2 per year.  States usually treated with course of septra in [place]. 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  Has nto has fever or other symptoms or malaria relapse this time.  2 weeks of odorous urine, dysuria.  No hesitance or stopping.  No urinary frequency, hesitancy or incontinence.  Took azo for 2 days, felt better.  4-5 days later returned.  Took azo for 3 more days, stopped for the past two days, and now dysuria starting to return.  No abdominal or pelvic pain.   No penile discharge or lesions.  No new sex partners  Hypertension  Elevated today.  Brings lots stating he check with 2 monitors several times daily at home and averages 100-130's. Review of Systems See HPI    Objective:   Physical Exam GEN: Alert & Oriented, No acute distress  declines  exam.        Assessment & Plan:

## 2012-03-11 NOTE — Assessment & Plan Note (Signed)
No evidence of cystitis on u/a.  Clinical symptoms not worrisome for prostatitis.  Will treat empirically with bactrim. Strongly encouraged him to return if does not improve with treatment

## 2012-05-02 ENCOUNTER — Other Ambulatory Visit: Payer: Self-pay | Admitting: Family Medicine

## 2012-05-05 ENCOUNTER — Ambulatory Visit: Payer: Self-pay | Admitting: Family Medicine

## 2012-05-05 ENCOUNTER — Telehealth: Payer: Self-pay | Admitting: Sports Medicine

## 2012-05-05 NOTE — Telephone Encounter (Signed)
Pt called and is req to re-est with Dr Amador Cunas. [place] Pt is having problems with bp med. And is req to come in to see Dr Amador Cunas asap. Pls advise.

## 2012-05-05 NOTE — Telephone Encounter (Signed)
Please advise ok since it looks like he has been seen by others recently

## 2012-05-13 NOTE — Telephone Encounter (Signed)
Please contact pt and schedule appt.

## 2012-05-13 NOTE — Telephone Encounter (Signed)
ok 

## 2012-05-16 ENCOUNTER — Ambulatory Visit (INDEPENDENT_AMBULATORY_CARE_PROVIDER_SITE_OTHER): Payer: Self-pay | Admitting: Internal Medicine

## 2012-05-16 ENCOUNTER — Encounter: Payer: Self-pay | Admitting: Internal Medicine

## 2012-05-16 VITALS — BP 190/100 | Temp 98.0°F | Ht 70.5 in | Wt 214.0 lb

## 2012-05-16 DIAGNOSIS — F411 Generalized anxiety disorder: Secondary | ICD-10-CM

## 2012-05-16 DIAGNOSIS — E785 Hyperlipidemia, unspecified: Secondary | ICD-10-CM

## 2012-05-16 DIAGNOSIS — I10 Essential (primary) hypertension: Secondary | ICD-10-CM

## 2012-05-16 DIAGNOSIS — Z23 Encounter for immunization: Secondary | ICD-10-CM

## 2012-05-16 MED ORDER — LOSARTAN POTASSIUM 100 MG PO TABS: 100.0000 mg | ORAL_TABLET | Freq: Every day | ORAL | Status: DC

## 2012-05-16 MED ORDER — TEMAZEPAM 15 MG PO CAPS: 15.0000 mg | ORAL_CAPSULE | Freq: Every evening | ORAL | Status: DC | PRN

## 2012-05-16 MED ORDER — ALPRAZOLAM 0.5 MG PO TABS: 0.5000 mg | ORAL_TABLET | Freq: Three times a day (TID) | ORAL | Status: DC | PRN

## 2012-05-16 MED ORDER — SILDENAFIL CITRATE 100 MG PO TABS: 100.0000 mg | ORAL_TABLET | ORAL | Status: DC

## 2012-05-16 MED ORDER — SIMVASTATIN 40 MG PO TABS: 40.0000 mg | ORAL_TABLET | Freq: Every evening | ORAL | Status: DC

## 2012-05-16 NOTE — Patient Instructions (Signed)
Limit your sodium (Salt) intake    It is important that you exercise regularly, at least 20 minutes 3 to 4 times per week.  If you develop chest pain or shortness of breath seek  medical attention.  You need to lose weight.  Consider a lower calorie diet and regular exercise.DASH Diet The DASH diet stands for "Dietary Approaches to Stop Hypertension." It is a healthy eating plan that has been shown to reduce high blood pressure (hypertension) in as little as 14 days, while also possibly providing other significant health benefits. These other health benefits include reducing the risk of breast cancer after menopause and reducing the risk of type 2 diabetes, heart disease, colon cancer, and stroke. Health benefits also include weight loss and slowing kidney failure in patients with chronic kidney disease.   DIET GUIDELINES  Limit salt (sodium). Your diet should contain less than 1500 mg of sodium daily.   Limit refined or processed carbohydrates. Your diet should include mostly whole grains. Desserts and added sugars should be used sparingly.   Include small amounts of heart-healthy fats. These types of fats include nuts, oils, and tub margarine. Limit saturated and trans fats. These fats have been shown to be harmful in the body.  CHOOSING FOODS   The following food groups are based on a 2000 calorie diet. See your Registered Dietitian for individual calorie needs. Grains and Grain Products (6 to 8 servings daily)  Eat More Often: Whole-wheat bread, brown rice, whole-grain or wheat pasta, quinoa, popcorn without added fat or salt (air popped).   Eat Less Often: White bread, white pasta, white rice, cornbread.  Vegetables (4 to 5 servings daily)  Eat More Often: Fresh, frozen, and canned vegetables. Vegetables may be raw, steamed, roasted, or grilled with a minimal amount of fat.   Eat Less Often/Avoid: Creamed or fried vegetables. Vegetables in a cheese sauce.  Fruit (4 to 5 servings  daily)  Eat More Often: All fresh, canned (in natural juice), or frozen fruits. Dried fruits without added sugar. One hundred percent fruit juice ( cup [237 mL] daily).   Eat Less Often: Dried fruits with added sugar. Canned fruit in light or heavy syrup.  Lean Meats, Fish, and Poultry (2 servings or less daily. One serving is 3 to 4 oz [85-114 g]).  Eat More Often: Ninety percent or leaner ground beef, tenderloin, sirloin. Round cuts of beef, chicken breast, turkey breast. All fish. Grill, bake, or broil your meat. Nothing should be fried.   Eat Less Often/Avoid: Fatty cuts of meat, turkey, or chicken leg, thigh, or wing. Fried cuts of meat or fish.  Dairy (2 to 3 servings)  Eat More Often: Low-fat or fat-free milk, low-fat plain or light yogurt, reduced-fat or part-skim cheese.   Eat Less Often/Avoid: Milk (whole, 2%, skim, or chocolate). Whole milk yogurt. Full-fat cheeses.  Nuts, Seeds, and Legumes (4 to 5 servings per week)  Eat More Often: All without added salt.   Eat Less Often/Avoid: Salted nuts and seeds, canned beans with added salt.  Fats and Sweets (limited)  Eat More Often: Vegetable oils, tub margarines without trans fats, sugar-free gelatin. Mayonnaise and salad dressings.   Eat Less Often/Avoid: Coconut oils, palm oils, butter, stick margarine, cream, half and half, cookies, candy, pie.  FOR MORE INFORMATION The Dash Diet Eating Plan: www.dashdiet.org Document Released: 07/30/2011 Document Reviewed: 07/20/2011 [employer] Patient Information 2012 [employer]. 

## 2012-05-16 NOTE — Progress Notes (Signed)
  Subjective:    Patient ID: Eric Hoffman, male    DOB: [birth year], 71 y.o.   MRN: [id number]  HPI  Wt Readings from Last 3 Encounters:  05/16/12 214 lb (97.07 kg)  03/11/12 211 lb (95.709 kg)  11/25/11 203 lb 6.4 oz (92.262 kg)    Review of Systems     Objective:   Physical Exam        Assessment & Plan:

## 2012-05-16 NOTE — Progress Notes (Signed)
Subjective:    Patient ID: Eric Hoffman, male    DOB: 11-Mar-1941, 71 y.o.   MRN: [id number]  HPI  71 year old patient who has a history of treated hypertension. He has not been seen in this office since November of 2011. In this frame he noted the onset of peripheral edema weight gain and amlodipine was discontinued. He has had no further peripheral edema but complaints include persistent weight gain. At that time he also has some significant muscle and joint pain which has improved at the present time he complains of some joint discomfort only. He still complains of weight gain. His present medication at the present time includes losartan 75 mg daily. He does monitor blood pressures carefully at home with normal readings present weight 214  BP Readings from Last 3 Encounters:  05/16/12 190/100  03/11/12 185/99  11/25/11 130/80    Wt Readings from Last 3 Encounters:  05/16/12 214 lb (97.07 kg)  03/11/12 211 lb (95.709 kg)  11/25/11 203 lb 6.4 oz (92.262 kg)    Past Medical History  Diagnosis Date  . Hyperlipidemia   . Hypertension   . Allergic rhinitis   . Dermatitis   . Malaria 1970 to 1982  . Anxiety     History   Social History  . Marital Status: Married    Spouse Name: N/A    Number of Children: N/A  . Years of Education: N/A   Occupational History  . Not on file.   Social History Main Topics  . Smoking status: Former Smoker -- 0.5 packs/day    Types: Cigarettes    Quit date: 08/24/2010  . Smokeless tobacco: Never Used  . Alcohol Use: 14.4 oz/week    10 Glasses of wine, 14 Shots of liquor per week  . Drug Use: No  . Sexually Active: Not Currently -- Male, Male partner(s)   Other Topics Concern  . Not on file   Social History Narrative  . No narrative on file    Past Surgical History  Procedure Date  . Tonsillectomy 1950  . Colonoscopy 12/2009    Family History  Problem Relation Age of Onset  . Cancer Mother     breast  . Cancer Brother    brain   . Cancer Brother     renal cancer    Allergies  Allergen Reactions  . Effexor (Venlafaxine Hydrochloride) Diarrhea    Current Outpatient Prescriptions on File Prior to Visit  Medication Sig Dispense Refill  . aspirin 81 MG tablet Take 160 mg by mouth daily.      . cetirizine (ZYRTEC) 10 MG tablet Take 10 mg by mouth daily.      . fish oil-omega-3 fatty acids 1000 MG capsule Take 2 g by mouth daily.      Marland Kitchen losartan (COZAAR) 25 MG tablet TAKE TWO TABLETS BY MOUTH DAILY  60 tablet  1  . sildenafil (VIAGRA) 100 MG tablet Take 1 tablet (100 mg total) by mouth as directed.  10 tablet  6  . simvastatin (ZOCOR) 40 MG tablet Take 1 tablet (40 mg total) by mouth every evening.  30 tablet  11  . amLODipine (NORVASC) 10 MG tablet Take 1 tablet (10 mg total) by mouth daily.  90 tablet  11  . DISCONTD: cimetidine (TAGAMET) 400 MG tablet Take 400 mg by mouth 2 (two) times daily as needed.      Marland Kitchen DISCONTD: metoprolol succinate (TOPROL-XL) 50 MG 24 hr tablet Take 1 tablet (50 mg  total) by mouth daily. Take with or immediately following a meal.  30 tablet  0    BP 190/100  Temp 98 F (36.7 C) (Oral)  Ht 5' 10.5" (1.791 m)  Wt 214 lb (97.07 kg)  BMI 30.27 kg/m2      Review of Systems  Constitutional: Positive for unexpected weight change. Negative for fever, chills, appetite change and fatigue.  HENT: Negative for hearing loss, ear pain, congestion, sore throat, trouble swallowing, neck stiffness, dental problem, voice change and tinnitus.   Eyes: Negative for pain, discharge and visual disturbance.  Respiratory: Negative for cough, chest tightness, wheezing and stridor.   Cardiovascular: Negative for chest pain, palpitations and leg swelling.  Gastrointestinal: Negative for nausea, vomiting, abdominal pain, diarrhea, constipation, blood in stool and abdominal distention.  Genitourinary: Negative for urgency, hematuria, flank pain, discharge, difficulty urinating and genital sores.        Also complains of decreased libido  Musculoskeletal: Positive for arthralgias. Negative for myalgias, back pain, joint swelling and gait problem.  Skin: Negative for rash.  Neurological: Negative for dizziness, syncope, speech difficulty, weakness, numbness and headaches.  Hematological: Negative for adenopathy. Does not bruise/bleed easily.  Psychiatric/Behavioral: Negative for behavioral problems and dysphoric mood. The patient is not nervous/anxious.        Objective:   Physical Exam  Constitutional: He is oriented to person, place, and time. He appears well-developed.       Weight 214  Blood pressure 160/95  HENT:  Head: Normocephalic.  Right Ear: External ear normal.  Left Ear: External ear normal.  Eyes: Conjunctivae normal and EOM are normal.  Neck: Normal range of motion.  Cardiovascular: Normal rate and normal heart sounds.   Pulmonary/Chest: Breath sounds normal.  Abdominal: Bowel sounds are normal.  Musculoskeletal: Normal range of motion. He exhibits no edema and no tenderness.  Neurological: He is alert and oriented to person, place, and time.  Psychiatric: He has a normal mood and affect. His behavior is normal.          Assessment & Plan:    Hypertension patient has been normal tensive at home and does state he has a history of whitecoat hypertension. We'll simplify his regimen and will place on losartan 100 mg tablet once daily. Due to his arthralgias will hold simvastatin at this time. Medications refilled. Will place on a calorie restricted diet Recheck in one month

## 2012-06-13 ENCOUNTER — Other Ambulatory Visit: Payer: Self-pay | Admitting: Internal Medicine

## 2012-06-13 MED ORDER — ALPRAZOLAM 0.5 MG PO TABS: 0.5000 mg | ORAL_TABLET | Freq: Three times a day (TID) | ORAL | Status: DC | PRN

## 2012-06-13 NOTE — Telephone Encounter (Signed)
Please advise ok to RF out of town - was just given printed rx at office visit - 9-23 -13

## 2012-06-13 NOTE — Telephone Encounter (Signed)
30

## 2012-06-13 NOTE — Telephone Encounter (Signed)
Pt needs new rx alprazolam 0.5mg  [facility] [phone]

## 2012-06-13 NOTE — Telephone Encounter (Signed)
Called in - and [facility] in [place]. Requested printed copy fax to [phone]

## 2012-07-01 ENCOUNTER — Other Ambulatory Visit: Payer: Self-pay | Admitting: Family Medicine

## 2012-07-01 ENCOUNTER — Other Ambulatory Visit: Payer: Self-pay

## 2012-07-01 MED ORDER — TEMAZEPAM 15 MG PO CAPS: 15.0000 mg | ORAL_CAPSULE | Freq: Every evening | ORAL | Status: DC | PRN

## 2012-07-12 ENCOUNTER — Encounter: Payer: Self-pay | Admitting: Physician Assistant

## 2012-07-12 ENCOUNTER — Emergency Department (HOSPITAL_COMMUNITY)
Admission: EM | Admit: 2012-07-12 | Discharge: 2012-07-12 | Discharge status: Against Medical Advice | Payer: Self-pay | Attending: Emergency Medicine | Admitting: Emergency Medicine

## 2012-07-12 ENCOUNTER — Ambulatory Visit: Payer: Self-pay | Admitting: Family Medicine

## 2012-07-12 ENCOUNTER — Encounter (HOSPITAL_COMMUNITY): Payer: Self-pay | Admitting: Cardiology

## 2012-07-12 VITALS — BP 180/108 | HR 91 | Temp 97.3°F | Resp 16 | Ht 71.0 in | Wt 214.2 lb

## 2012-07-12 DIAGNOSIS — E785 Hyperlipidemia, unspecified: Secondary | ICD-10-CM | POA: Insufficient documentation

## 2012-07-12 DIAGNOSIS — R4781 Slurred speech: Secondary | ICD-10-CM

## 2012-07-12 DIAGNOSIS — I10 Essential (primary) hypertension: Secondary | ICD-10-CM

## 2012-07-12 DIAGNOSIS — Z79899 Other long term (current) drug therapy: Secondary | ICD-10-CM | POA: Insufficient documentation

## 2012-07-12 DIAGNOSIS — Q674 Other congenital deformities of skull, face and jaw: Secondary | ICD-10-CM | POA: Insufficient documentation

## 2012-07-12 DIAGNOSIS — Z8613 Personal history of malaria: Secondary | ICD-10-CM | POA: Insufficient documentation

## 2012-07-12 DIAGNOSIS — R51 Headache: Secondary | ICD-10-CM | POA: Insufficient documentation

## 2012-07-12 DIAGNOSIS — G459 Transient cerebral ischemic attack, unspecified: Secondary | ICD-10-CM | POA: Insufficient documentation

## 2012-07-12 DIAGNOSIS — F17200 Nicotine dependence, unspecified, uncomplicated: Secondary | ICD-10-CM | POA: Insufficient documentation

## 2012-07-12 DIAGNOSIS — Z8669 Personal history of other diseases of the nervous system and sense organs: Secondary | ICD-10-CM | POA: Insufficient documentation

## 2012-07-12 DIAGNOSIS — F411 Generalized anxiety disorder: Secondary | ICD-10-CM | POA: Insufficient documentation

## 2012-07-12 DIAGNOSIS — Z7982 Long term (current) use of aspirin: Secondary | ICD-10-CM | POA: Insufficient documentation

## 2012-07-12 DIAGNOSIS — R4789 Other speech disturbances: Secondary | ICD-10-CM | POA: Insufficient documentation

## 2012-07-12 LAB — BASIC METABOLIC PANEL
CO2: 29 mEq/L (ref 19–32)
Calcium: 9.7 mg/dL (ref 8.4–10.5)
Creatinine, Ser: 0.87 mg/dL (ref 0.50–1.35)
Glucose, Bld: 177 mg/dL — ABNORMAL HIGH (ref 70–99)

## 2012-07-12 LAB — GLUCOSE, POCT (MANUAL RESULT ENTRY): POC Glucose: 128 mg/dl — AB (ref 70–99)

## 2012-07-12 LAB — CBC
HCT: 46.8 % (ref 39.0–52.0)
Hemoglobin: 16 g/dL (ref 13.0–17.0)
MCH: 31.9 pg (ref 26.0–34.0)
MCV: 93.2 fL (ref 78.0–100.0)
RBC: 5.02 MIL/uL (ref 4.22–5.81)

## 2012-07-12 NOTE — Progress Notes (Signed)
[address] [place] [address]   Phone [phone]  Subjective:    Patient ID: Eric Hoffman, male    DOB: 06/24/1941, 71 y.o.   MRN: [id number]  HPI Pt presents to clinic with about 2 hr h/o slurred speech and drooping facial muscles.  2 days ago he started to developed a low grade HA after eating a candy bar.  Last pm he felt bad but was unsure what exactly was making him feel bad.  He woke up this am and felt fine, went to the airport and then when he got home he complained to his wife about having a HA.  Then about 10:30 he developed sweaty sensation without chest pain and wife noticed that his speech was slurred and his L eyelid and lips were drooping.  He did not experience any trouble finding words or thoughts and he was not confused.  He had no weakness or paresthesias.  Per wife he is better that he was 30 mins ago but his speech is not normal nor is his eyelid or lip on the L side.  He has know h/o difficult to control HTN, hyperlipidemia and is a smoker.  He has never had one of these episodes before.  His BP at home is always better than it is at a medical office, typically runs 120s/80s but this am 150/90 at home.  He currently has a HA, low grade.   Review of Systems  Gastrointestinal: Negative for nausea.  Neurological: Positive for headaches. Negative for dizziness, weakness, light-headedness and numbness.       Objective:   Physical Exam  Vitals reviewed. Constitutional: He is oriented to person, place, and time. He appears well-developed and well-nourished.  HENT:  Head: Normocephalic and atraumatic.  Right Ear: External ear normal.  Left Ear: External ear normal.  Eyes: Conjunctivae normal are normal. Pupils are equal, round, and reactive to light.  Cardiovascular: Normal rate, regular rhythm and normal heart sounds.   Pulmonary/Chest: Effort normal and breath sounds normal.  Neurological: He is alert and oriented to person, place, and time. He has normal  strength. He is not disoriented. He displays normal reflexes. No cranial nerve deficit or sensory deficit.  Reflex Scores:      Bicep reflexes are 2+ on the right side and 2+ on the left side.      Brachioradialis reflexes are 2+ on the right side and 2+ on the left side.      Patellar reflexes are 2+ on the right side and 2+ on the left side.      Achilles reflexes are 2+ on the right side and 2+ on the left side.      I do not notice slurred speech, pt is able to hold a conversation without difficulty.  Skin: Skin is warm and dry.  Psychiatric: He has a normal mood and affect. His behavior is normal. Judgment and thought content normal.   Results for orders placed in visit on 07/12/12  GLUCOSE, POCT (MANUAL RESULT ENTRY)      Component Value Range   POC Glucose 128 (*) 70 - 99 mg/dl        Assessment & Plan:   1. Essential hypertension, benign  EKG 12-Lead  2. Slurred speech  POCT glucose (manual entry)   I am worried about a TIA or possible early stroke.  With the fact that the patient is improving but due to this 1st episode and the fact that and uncontrolled HTN pt  to ED for further evaluation. Pt is from the [place] and has insurance there and stated that if things did not get better over the next 24h he would just buy a plane ticket and go home.  I discouraged that due to this being a possible stroke or a precursor and pt did not want to be on an airplane and something terrible happen. I wanted to start an IV and send EMS but pt refused transport but did finally agree to go to ED by personal vehicle with his wife.    D/w Dr. Katrinka Blazing.

## 2012-07-12 NOTE — ED Notes (Signed)
Called Dr. Ethelda Chick to access pt for possible code stroke. Dr. Ethelda Chick stated not to call the pt a code stroke at this time.

## 2012-07-12 NOTE — ED Provider Notes (Signed)
History     CSN: [id number]  Arrival date & time 07/12/12  1248   First MD Initiated Contact with Patient 07/12/12 1457      Chief Complaint  Patient presents with  . Stroke like symtpoms     (Consider location/radiation/quality/duration/timing/severity/associated sxs/prior treatment) The history is provided by the patient, the spouse and medical records.   CARRINGTON OLAZABAL is a 71 y.o. male presents to the emergency department from [facility] for possible TIA workup. Patient starts that he was getting in his car this morning approximately 11 AM when he became diaphoretic, had noticeable left-sided facial droop and slurred speech. He states at that time he had a sudden onset headache and felt "strange." 2 hours later the patient presented to urgent care with some resolution of symptoms the patient was not at baseline.  The symptoms began suddenly, rapidly worsened and have gradually improved.  Patient has a history of hypertension and hypercholesteremia.  He is a smoker.  Patient has no history of DVT or CVA.  Patient denies changes in vision, chest pain, shortness of breath, abdominal pain, nausea, vomiting, diarrhea, weakness, syncope.    Past Medical History  Diagnosis Date  . Hyperlipidemia   . Hypertension   . Allergic rhinitis   . Dermatitis   . Malaria 1970 to 1982  . Anxiety     Past Surgical History  Procedure Date  . Tonsillectomy 1950  . Colonoscopy 12/2009    Family History  Problem Relation Age of Onset  . Cancer Mother     breast  . Cancer Brother     brain   . Cancer Brother     renal cancer    History  Substance Use Topics  . Smoking status: Current Every Day Smoker -- 1.0 packs/day    Types: Cigarettes    Last Attempt to Quit: 08/24/2010  . Smokeless tobacco: Never Used  . Alcohol Use: 14.4 oz/week    10 Glasses of wine, 14 Shots of liquor per week      Review of Systems  Constitutional: Negative for fever, diaphoresis,  appetite change, fatigue and unexpected weight change.  HENT: Negative for mouth sores and neck stiffness.   Eyes: Negative for visual disturbance.  Respiratory: Negative for cough, chest tightness, shortness of breath and wheezing.   Cardiovascular: Negative for chest pain.  Gastrointestinal: Negative for nausea, vomiting, abdominal pain, diarrhea and constipation.  Genitourinary: Negative for dysuria, urgency, frequency and hematuria.  Skin: Negative for rash.  Neurological: Positive for facial asymmetry, speech difficulty and headaches. Negative for syncope and light-headedness.  Psychiatric/Behavioral: Negative for sleep disturbance. The patient is not nervous/anxious.   All other systems reviewed and are negative.    Allergies  Effexor  Home Medications   Current Outpatient Rx  Name  Route  Sig  Dispense  Refill  . ALPRAZOLAM 0.5 MG PO TABS   Oral   Take 0.5 mg by mouth 3 (three) times daily as needed. For anxiety         . ASPIRIN 325 MG PO TABS   Oral   Take 325 mg by mouth daily.         Marland Kitchen CETIRIZINE HCL 10 MG PO TABS   Oral   Take 10 mg by mouth daily as needed. For allergies         . LOSARTAN POTASSIUM 100 MG PO TABS   Oral   Take 1 tablet (100 mg total) by mouth daily.  90 tablet   5   . SILDENAFIL CITRATE 100 MG PO TABS   Oral   Take 100 mg by mouth daily as needed. For Erectile Dysfunction         . TEMAZEPAM 15 MG PO CAPS   Oral   Take 15-30 mg by mouth at bedtime as needed. For sleep           BP 175/79  Pulse 83  Temp 98 F (36.7 C) (Oral)  Resp 16  SpO2 100%  Physical Exam  Nursing note and vitals reviewed. Constitutional: He is oriented to person, place, and time. He appears well-developed and well-nourished. No distress.  HENT:  Head: Atraumatic.  Right Ear: Tympanic membrane, external ear and ear canal normal.  Left Ear: Tympanic membrane, external ear and ear canal normal.  Nose: Nose normal. Right sinus exhibits no  maxillary sinus tenderness and no frontal sinus tenderness. Left sinus exhibits no maxillary sinus tenderness and no frontal sinus tenderness.  Mouth/Throat: Uvula is midline, oropharynx is clear and moist and mucous membranes are normal. No oropharyngeal exudate.       Possible small amount of facial droop on the left Tongue deviates to the right  Eyes: Conjunctivae normal and EOM are normal. Pupils are equal, round, and reactive to light. No scleral icterus.  Neck: Normal range of motion. Neck supple.  Cardiovascular: Normal rate, regular rhythm, normal heart sounds and intact distal pulses.  Exam reveals no gallop and no friction rub.   No murmur heard. Pulmonary/Chest: Effort normal and breath sounds normal. No respiratory distress. He has no wheezes. He has no rales. He exhibits no tenderness.  Abdominal: Soft. Bowel sounds are normal. He exhibits no distension and no mass. There is no tenderness. There is no rebound and no guarding.  Musculoskeletal: Normal range of motion. He exhibits no edema and no tenderness.  Lymphadenopathy:    He has no cervical adenopathy.  Neurological: He is alert and oriented to person, place, and time. He has normal reflexes. He exhibits normal muscle tone. Coordination normal.       Speech is goal oriented, follows commands - Janann August states his speech is still slightly slurred however he holds conversation without difficulty Major Cranial nerves without deficit, ? Mild facial droop on the left Normal strength in upper and lower extremities bilaterally including dorsiflexion and plantar flexion, strong and equal grip strength Sensation normal to light and sharp touch Moves extremities without ataxia, coordination intact Normal finger to nose and rapid alternating movements Neg romberg, no pronator drift Normal gait Normal heel-shin and balance   Skin: Skin is warm and dry. No rash noted. He is not diaphoretic. No erythema.  Psychiatric: He has a normal mood  and affect.    ED Course  Procedures (including critical care time)  Labs Reviewed  BASIC METABOLIC PANEL - Abnormal; Notable for the following:    Glucose, Bld 177 (*)     GFR calc non Af Amer 85 (*)     All other components within normal limits  CBC  POCT I-STAT TROPONIN I   No results found.   1. Slurred speech   2. TIA (transient ischemic attack)       MDM  Suzanne Boron presents emergency department after TIA symptoms. On exam here symptoms largely resolved.  BMP, CBC and i-STAT troponin all within normal limits.  ECG from urgent care reviewed with sinus rhythm, right bundle branch block - nonischemic ECG however there is no old  for comparison.  No repeat ECG performed here in the emergency department.  Discussed at length my concerns the patient about possible TIA and the workup needed to ensure he's not at risk for stroke. ABCD2 score is 5 exam moderate risk.  Patient states he's from the [place] does not have insurance here. He states he does not want a TIA workup, he is refusing a CT scan and all further imaging.  He states he does not believe he is having a stroke and if he is he will get on a plane and go back to the [place] where he can be treated.  I advised him that if he continues to have symptoms or symptoms worsen thickening on plain is a poor decision.  I have also advised him that he is having a stroke and he leaves he could result in permanent damage including death.  I have also discussed reasons to return immediately to the ER.  Patient expresses understanding and wishes to leave AMA.    Dr. [place] Sheldon was consulted, evaluated this patient with me and agrees with the plan - he was also notified of the patient's decision to leave AMA.             Dahlia Client Kordelia Severin, PA-C 07/13/12 0004

## 2012-07-12 NOTE — ED Notes (Addendum)
Pt here from [facility] for possible TIA work-up. Pt reports he was getting in the car this afternoon and started to feel weird. Reports his left eye feels strange and wife reported slurred speech. No neuro deficits noted at this time. Reports slight pain in his lower legs.

## 2012-07-12 NOTE — ED Notes (Signed)
EKG completed at [facility].

## 2012-07-13 NOTE — ED Provider Notes (Signed)
Medical screening examination/treatment/procedure(s) were performed by non-physician practitioner and as supervising physician I was immediately available for consultation/collaboration.   Charles B. Bernette Mayers, MD 07/13/12 [phone]

## 2012-07-15 ENCOUNTER — Telehealth: Payer: Self-pay | Admitting: Physician Assistant

## 2012-07-19 ENCOUNTER — Encounter: Payer: Self-pay | Admitting: Internal Medicine

## 2012-07-19 ENCOUNTER — Ambulatory Visit (INDEPENDENT_AMBULATORY_CARE_PROVIDER_SITE_OTHER): Payer: Self-pay | Admitting: Internal Medicine

## 2012-07-19 VITALS — BP 190/90 | HR 88 | Temp 98.5°F | Resp 20 | Wt 220.0 lb

## 2012-07-19 DIAGNOSIS — J309 Allergic rhinitis, unspecified: Secondary | ICD-10-CM

## 2012-07-19 DIAGNOSIS — I10 Essential (primary) hypertension: Secondary | ICD-10-CM

## 2012-07-19 DIAGNOSIS — I739 Peripheral vascular disease, unspecified: Secondary | ICD-10-CM

## 2012-07-19 DIAGNOSIS — Z720 Tobacco use: Secondary | ICD-10-CM

## 2012-07-19 DIAGNOSIS — J329 Chronic sinusitis, unspecified: Secondary | ICD-10-CM

## 2012-07-19 DIAGNOSIS — E785 Hyperlipidemia, unspecified: Secondary | ICD-10-CM

## 2012-07-19 DIAGNOSIS — F17200 Nicotine dependence, unspecified, uncomplicated: Secondary | ICD-10-CM

## 2012-07-19 MED ORDER — AMOXICILLIN-POT CLAVULANATE 875-125 MG PO TABS: 1.0000 | ORAL_TABLET | Freq: Two times a day (BID) | ORAL | Status: DC

## 2012-07-19 MED ORDER — CHLORTHALIDONE 25 MG PO TABS: 25.0000 mg | ORAL_TABLET | Freq: Every day | ORAL | Status: DC

## 2012-07-19 MED ORDER — ATORVASTATIN CALCIUM 20 MG PO TABS: 20.0000 mg | ORAL_TABLET | Freq: Every day | ORAL | Status: DC

## 2012-07-19 MED ORDER — ASPIRIN 81 MG PO TBEC: 81.0000 mg | DELAYED_RELEASE_TABLET | Freq: Every day | ORAL | Status: DC

## 2012-07-19 MED ORDER — FLUTICASONE PROPIONATE 50 MCG/ACT NA SUSP: 2.0000 | Freq: Every day | NASAL | Status: DC

## 2012-07-19 NOTE — Patient Instructions (Signed)
Moderate your alcohol use  Smoking tobacco is very bad for your health. You should stop smoking immediately.  Lower extremity arterial Doppler study as discussed  Take your antibiotic as prescribed until ALL of it is gone, but stop if you develop a rash, swelling, or any side effects of the medication.  Contact our office as soon as possible if  there are side effects of the medication.   Use saline irrigation, warm  moist compresses and over-the-counter decongestants only as directed.  Call if there is no improvement in 5 to 7 days, or sooner if you develop increasing pain, fever, or any new symptoms.

## 2012-07-19 NOTE — Progress Notes (Signed)
Subjective:    Patient ID: Eric Hoffman, male    DOB: Oct 29, 1940, 71 y.o.   MRN: [id number]  HPI  71 year old patient who is seen today for followup. 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  He does have a history of allergic rhinitis and ongoing tobacco use. Medical problems include hypertension. Today he complains of severe sinus pain and headaches. He has been self treated with saline irrigation. Last visit simvastatin was held due 2 cramps in the calf that have improved. Today he gives a history of significant claudication with rapid walking it is quickly relieved by rest Blood pressure poorly controlled today He has been on amlodipine 10 mg in the past with  issues with edema  Past Medical History  Diagnosis Date  . Hyperlipidemia   . Hypertension   . Allergic rhinitis   . Dermatitis   . Malaria 1970 to 1982  . Anxiety     History   Social History  . Marital Status: Married    Spouse Name: N/A    Number of Children: N/A  . Years of Education: N/A   Occupational History  . Not on file.   Social History Main Topics  . Smoking status: Current Every Day Smoker -- 1.0 packs/day    Types: Cigarettes    Last Attempt to Quit: 08/24/2010  . Smokeless tobacco: Never Used  . Alcohol Use: 14.4 oz/week    10 Glasses of wine, 14 Shots of liquor per week  . Drug Use: No  . Sexually Active: Not Currently -- Male, Male partner(s)   Other Topics Concern  . Not on file   Social History Narrative  . No narrative on file    Past Surgical History  Procedure Date  . Tonsillectomy 1950  . Colonoscopy 12/2009    Family History  Problem Relation Age of Onset  . Cancer Mother     breast  . Cancer Brother     brain   . Cancer Brother     renal cancer    Allergies  Allergen Reactions  . Effexor (Venlafaxine Hydrochloride) Swelling    Of throat    Current Outpatient  Prescriptions on File Prior to Visit  Medication Sig Dispense Refill  . ALPRAZolam (XANAX) 0.5 MG tablet Take 0.5 mg by mouth 3 (three) times daily as needed. For anxiety      . aspirin 325 MG tablet Take 325 mg by mouth daily.      . cetirizine (ZYRTEC) 10 MG tablet Take 10 mg by mouth daily as needed. For allergies      . losartan (COZAAR) 100 MG tablet Take 1 tablet (100 mg total) by mouth daily.  90 tablet  5  . sildenafil (VIAGRA) 100 MG tablet Take 100 mg by mouth daily as needed. For Erectile Dysfunction      . temazepam (RESTORIL) 15 MG capsule Take 15-30 mg by mouth at bedtime as needed. For sleep      . [DISCONTINUED] cimetidine (TAGAMET) 400 MG tablet Take 400 mg by mouth 2 (two) times daily as needed.      . [DISCONTINUED] metoprolol succinate (TOPROL-XL) 50 MG 24 hr tablet Take 1 tablet (50 mg total) by mouth daily. Take with or immediately following a meal.  30 tablet  0    BP 190/90  Pulse 88  Temp 98.5 F (36.9 C) (Oral)  Resp 20  Wt 220 lb (99.791 kg)  SpO2 97%       Review of Systems  Constitutional: Negative for fever, chills, appetite change and fatigue.  HENT: Positive for congestion, sore throat and postnasal drip. Negative for hearing loss, ear pain, trouble swallowing, neck stiffness, dental problem, voice change and tinnitus.   Eyes: Negative for pain, discharge and visual disturbance.  Respiratory: Negative for cough, chest tightness, wheezing and stridor.   Cardiovascular: Negative for chest pain, palpitations and leg swelling.  Gastrointestinal: Negative for nausea, vomiting, abdominal pain, diarrhea, constipation, blood in stool and abdominal distention.  Genitourinary: Negative for urgency, hematuria, flank pain, discharge, difficulty urinating and genital sores.  Musculoskeletal: Negative for myalgias, back pain, joint swelling, arthralgias and gait problem.  Skin: Negative for rash.  Neurological: Positive for headaches. Negative for dizziness,  syncope, speech difficulty, weakness and numbness.  Hematological: Negative for adenopathy. Does not bruise/bleed easily.  Psychiatric/Behavioral: Negative for behavioral problems and dysphoric mood. The patient is not nervous/anxious.        Objective:   Physical Exam  Constitutional: He is oriented to person, place, and time. He appears well-developed.       Blood pressure 180/90  HENT:  Head: Normocephalic.  Right Ear: External ear normal.  Left Ear: External ear normal.  Eyes: Conjunctivae normal and EOM are normal.  Neck: Normal range of motion.  Cardiovascular: Normal rate and normal heart sounds.        Pedal pulses not easily palpable  Pulmonary/Chest: Breath sounds normal.  Abdominal: Bowel sounds are normal.  Musculoskeletal: Normal range of motion. He exhibits no edema and no tenderness.  Neurological: He is alert and oriented to person, place, and time.  Psychiatric: He has a normal mood and affect. His behavior is normal.          Assessment & Plan:  Sinusitis. Will continue saline irrigation and placed on antibiotic therapy. Cessation of smoking encouraged Claudication. We'll check an arterial Doppler. Risk factor modification encouraged Hypertension poor control. Will add diuretic therapy Tobacco abuse Dyslipidemia. We'll give samples of atorvastatin 20  Recheck 1 month

## 2012-07-25 ENCOUNTER — Telehealth: Payer: Self-pay | Admitting: *Deleted

## 2012-07-25 ENCOUNTER — Other Ambulatory Visit: Payer: Self-pay | Admitting: Cardiology

## 2012-07-25 ENCOUNTER — Encounter (INDEPENDENT_AMBULATORY_CARE_PROVIDER_SITE_OTHER): Payer: Self-pay

## 2012-07-25 DIAGNOSIS — I70219 Atherosclerosis of native arteries of extremities with intermittent claudication, unspecified extremity: Secondary | ICD-10-CM

## 2012-07-25 DIAGNOSIS — I739 Peripheral vascular disease, unspecified: Secondary | ICD-10-CM

## 2012-07-25 NOTE — Telephone Encounter (Signed)
Received call from [facility] in Vascular lab regarding pt. 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 Matt wants to know if he should proceed with Vascular Referral or does Dr. Amador Cunas want to refer? Told Matt I would call him back. Dr. Amador Cunas given verbal report as above and said they can refer pt  to vascular. Called Matt back and told him he is okay to refer to Vascular per Dr. Amador Cunas. Matt verbalized understanding.

## 2012-08-01 ENCOUNTER — Telehealth: Payer: Self-pay | Admitting: Internal Medicine

## 2012-08-01 NOTE — Telephone Encounter (Signed)
Pt will need temazepan refilled in a week. Pt takes at least one a night (sometimes 2) and script was written "As needed". Only received 30 pills.

## 2012-08-02 ENCOUNTER — Other Ambulatory Visit: Payer: Self-pay | Admitting: *Deleted

## 2012-08-02 MED ORDER — TEMAZEPAM 15 MG PO CAPS: 15.0000 mg | ORAL_CAPSULE | Freq: Every evening | ORAL | Status: DC | PRN

## 2012-08-02 NOTE — Telephone Encounter (Signed)
60

## 2012-08-02 NOTE — Telephone Encounter (Signed)
Left message on voicemail. Rx refill for Temazepam sent to pharmacy.

## 2012-08-03 ENCOUNTER — Ambulatory Visit: Payer: Self-pay | Admitting: Cardiovascular Disease

## 2012-08-03 NOTE — Telephone Encounter (Signed)
Pt notified Rx refill for Temazepam was sent to the pharmacy. Pt verbalized understanding.

## 2012-08-15 ENCOUNTER — Ambulatory Visit: Payer: Self-pay | Admitting: Internal Medicine

## 2012-08-25 ENCOUNTER — Encounter: Payer: Self-pay | Admitting: Internal Medicine

## 2012-08-25 ENCOUNTER — Telehealth: Payer: Self-pay | Admitting: Internal Medicine

## 2012-08-25 ENCOUNTER — Ambulatory Visit (INDEPENDENT_AMBULATORY_CARE_PROVIDER_SITE_OTHER): Payer: BC Managed Care – PPO | Admitting: Internal Medicine

## 2012-08-25 VITALS — BP 160/84 | HR 72 | Temp 98.0°F | Resp 18 | Wt 216.0 lb

## 2012-08-25 DIAGNOSIS — Z720 Tobacco use: Secondary | ICD-10-CM

## 2012-08-25 DIAGNOSIS — I10 Essential (primary) hypertension: Secondary | ICD-10-CM

## 2012-08-25 DIAGNOSIS — I739 Peripheral vascular disease, unspecified: Secondary | ICD-10-CM

## 2012-08-25 DIAGNOSIS — G4730 Sleep apnea, unspecified: Secondary | ICD-10-CM

## 2012-08-25 DIAGNOSIS — F17200 Nicotine dependence, unspecified, uncomplicated: Secondary | ICD-10-CM

## 2012-08-25 DIAGNOSIS — E785 Hyperlipidemia, unspecified: Secondary | ICD-10-CM

## 2012-08-25 MED ORDER — ALPRAZOLAM 0.5 MG PO TABS: 0.5000 mg | ORAL_TABLET | Freq: Three times a day (TID) | ORAL | Status: DC | PRN

## 2012-08-25 MED ORDER — AMLODIPINE BESYLATE 5 MG PO TABS: 5.0000 mg | ORAL_TABLET | Freq: Every day | ORAL | Status: DC

## 2012-08-25 NOTE — Patient Instructions (Addendum)
Limit your sodium (Salt) intake  Please check your blood pressure on a regular basis.  If it is consistently greater than 150/90, please make an office appointment.  Return in 3 months for follow-up   Smoking tobacco is very bad for your health. You should stop smoking immediately.

## 2012-08-25 NOTE — Telephone Encounter (Signed)
Message copied by Jimmye Norman on Thu Aug 25, 2012  4:01 PM ------      Message from: Alfonso Ramus J      Created: Thu Aug 25, 2012  3:30 PM      Regarding: RE: Home Sleep Study       Genella Mech:      I set up the home sleep studies for [facility]. I will contact the patient and arrange set up for this study. I see where the order has been placed.       My only question is, if this patient has BCBS, has the prior authorization been completed on this patient?       Please advise.      Thanks,      Alfonso Ramus, [facility]      [phone]      ----- Message -----         From: Jimmye Norman, LPN         Sent: 08/25/2012   3:12 PM           To: Ollen Gross      Subject: Home Sleep Study                                         Orders are in [facility] call if any questions.            Janene Harvey      [phone] ext [phone]

## 2012-08-25 NOTE — Telephone Encounter (Signed)
Pt notified correct amount of pills called into pharmacy for Rx. Also someone will contact you regarding sleep study. Pt verbalized understanding.

## 2012-08-25 NOTE — Progress Notes (Signed)
Subjective:    Patient ID: Eric Hoffman, male    DOB: 02/10/1941, 72 y.o.   MRN: [id number]  HPI  72 year old patient who has treated hypertension and dyslipidemia. At his last visit he complained of significant claudication and lower extremity duplex Doppler evaluation confirmed  significant PAD. He now has obtained a health insurance and is interested in referral. History of hypertension and dyslipidemia and ongoing tobacco use. Risk factor modification discussed.  His claudication is unchanged. If he walks slowly he develops no pain but if he walks at a brisker pace he can only walk approximately 100 yards before significant pain. It is lifestyle limiting. Medical regimen includes losartan that he feels has caused significant cough. He is accompanied  by his wife today who is concerned about disruptive  nighttime breathing with periods of apnea  Past Medical History  Diagnosis Date  . Hyperlipidemia   . Hypertension   . Allergic rhinitis   . Dermatitis   . Malaria 1970 to 1982  . Anxiety     History   Social History  . Marital Status: Married    Spouse Name: N/A    Number of Children: N/A  . Years of Education: N/A   Occupational History  . Not on file.   Social History Main Topics  . Smoking status: Current Every Day Smoker -- 1.0 packs/day    Types: Cigarettes    Last Attempt to Quit: 08/24/2010  . Smokeless tobacco: Never Used  . Alcohol Use: 14.4 oz/week    10 Glasses of wine, 14 Shots of liquor per week  . Drug Use: No  . Sexually Active: Not Currently -- Male, Male partner(s)   Other Topics Concern  . Not on file   Social History Narrative  . No narrative on file    Past Surgical History  Procedure Date  . Tonsillectomy 1950  . Colonoscopy 12/2009    Family History  Problem Relation Age of Onset  . Cancer Mother     breast  . Cancer Brother     brain   . Cancer Brother     renal cancer    Allergies  Allergen Reactions  . Effexor  (Venlafaxine Hydrochloride) Swelling    Of throat    Current Outpatient Prescriptions on File Prior to Visit  Medication Sig Dispense Refill  . aspirin 81 MG EC tablet Take 1 tablet (81 mg total) by mouth daily. Swallow whole.  30 tablet  12  . atorvastatin (LIPITOR) 20 MG tablet Take 1 tablet (20 mg total) by mouth daily.  90 tablet  3  . cetirizine (ZYRTEC) 10 MG tablet Take 10 mg by mouth daily as needed. For allergies      . chlorthalidone (HYGROTON) 25 MG tablet Take 1 tablet (25 mg total) by mouth daily.  90 tablet  2  . fluticasone (FLONASE) 50 MCG/ACT nasal spray Place 2 sprays into the nose daily.  16 g  6  . sildenafil (VIAGRA) 100 MG tablet Take 100 mg by mouth daily as needed. For Erectile Dysfunction      . temazepam (RESTORIL) 15 MG capsule Take 1-2 capsules (15-30 mg total) by mouth at bedtime as needed. For sleep  60 capsule  0  . amLODipine (NORVASC) 5 MG tablet Take 1 tablet (5 mg total) by mouth daily.  90 tablet  3  . [DISCONTINUED] cimetidine (TAGAMET) 400 MG tablet Take 400 mg by mouth 2 (two) times daily as needed.      . [  DISCONTINUED] metoprolol succinate (TOPROL-XL) 50 MG 24 hr tablet Take 1 tablet (50 mg total) by mouth daily. Take with or immediately following a meal.  30 tablet  0    BP 160/84  Pulse 72  Temp 98 F (36.7 C) (Oral)  Resp 18  Wt 216 lb (97.977 kg)  SpO2 93%        Review of Systems  Constitutional: Negative for fever, chills, appetite change and fatigue.  HENT: Negative for hearing loss, ear pain, congestion, sore throat, trouble swallowing, neck stiffness, dental problem, voice change and tinnitus.   Eyes: Negative for pain, discharge and visual disturbance.  Respiratory: Positive for cough. Negative for chest tightness, wheezing and stridor.   Cardiovascular: Negative for chest pain, palpitations and leg swelling.  Gastrointestinal: Negative for nausea, vomiting, abdominal pain, diarrhea, constipation, blood in stool and abdominal  distention.  Genitourinary: Negative for urgency, hematuria, flank pain, discharge, difficulty urinating and genital sores.  Musculoskeletal: Negative for myalgias, back pain, joint swelling, arthralgias and gait problem.  Skin: Negative for rash.  Neurological: Negative for dizziness, syncope, speech difficulty, weakness, numbness and headaches.  Hematological: Negative for adenopathy. Does not bruise/bleed easily.  Psychiatric/Behavioral: Positive for sleep disturbance. Negative for behavioral problems and dysphoric mood. The patient is nervous/anxious.        Objective:   Physical Exam  Constitutional: He is oriented to person, place, and time. He appears well-developed.       Repeat blood pressure 124/80  HENT:  Head: Normocephalic.  Right Ear: External ear normal.  Left Ear: External ear normal.  Eyes: Conjunctivae normal and EOM are normal.  Neck: Normal range of motion.  Cardiovascular: Normal rate and normal heart sounds.        Pedal pulses absent  Pulmonary/Chest: Breath sounds normal.  Abdominal: Bowel sounds are normal.  Musculoskeletal: Normal range of motion. He exhibits no edema and no tenderness.  Neurological: He is alert and oriented to person, place, and time.  Psychiatric: He has a normal mood and affect. His behavior is normal.          Assessment & Plan:  Hypertension controlled. We'll discontinue olmesartan and switched to amlodipine PAD with symptomatic claudication. We'll refer to vascular surgery Dyslipidemia. Continue atorvastatin Ongoing tobacco use. Total cessation of smoking encouraged  Recheck 3 months Home sleep study to exclude OSA

## 2012-08-25 NOTE — Addendum Note (Signed)
Addended by: Jimmye Norman on: 08/25/2012 03:26 PM   Modules accepted: Orders

## 2012-08-25 NOTE — Telephone Encounter (Signed)
Patient called stating that he was given an rx for aprazolam 0.5mg  1po TID for anxiety and he was only given 60 and it should have been 90 please call the correctly rx into [facility]. [address] and inform patient when done.

## 2012-08-31 ENCOUNTER — Telehealth: Payer: Self-pay | Admitting: *Deleted

## 2012-08-31 DIAGNOSIS — G4730 Sleep apnea, unspecified: Secondary | ICD-10-CM

## 2012-08-31 NOTE — Telephone Encounter (Signed)
Message copied by Jimmye Norman on Wed Aug 31, 2012  3:37 PM ------      Message from: Cherlyn Labella      Created: Wed Aug 31, 2012  8:12 AM      Regarding: sleep study       Pt was not approved for home sleep test [employer]BS feels he did not meet the criteria for them to approve a sleep study at this time

## 2012-08-31 NOTE — Telephone Encounter (Signed)
Dr. Amador Cunas, can we do a referral to Pulmonary and let them handle testing needed. Pulmonary said they can obtain more information for insurance to approve testing.

## 2012-09-01 ENCOUNTER — Telehealth: Payer: Self-pay | Admitting: Internal Medicine

## 2012-09-01 NOTE — Telephone Encounter (Signed)
Patient Information:  Caller Name: Child  Phone: [phone]  Patient: Eric Hoffman, Eric Hoffman  Gender: Male  DOB: 1941-05-22  Age: 72 Years  PCP: Eleonore Chiquito [facility])  Office Follow Up:  Does the office need to follow up with this patient?: No  Instructions For The Office: N/A  RN Note:  Reviewed home instructions and scheduling appt.  Symptoms  Reason For Call & Symptoms: Onset of UTI symptoms 4 days ago. No fever. Patient states he had issues like this before and it was related to his blood pressure medication. He has treated with Azo with no relief. He can urinate. no abdominal , no n/v/d  Reviewed Health History In EMR: Yes  Reviewed Medications In EMR: Yes  Reviewed Allergies In EMR: Yes  Reviewed Surgeries / Procedures: No  Date of Onset of Symptoms: 08/28/2012  Treatments Tried: OTC pain relief [facility] -Azo.  Treatments Tried Worked: No  Guideline(s) Used:  Urination Pain - Male  Disposition Per Guideline:   See Today in Office  Reason For Disposition Reached:   All other males with painful urination, or patient wants to be seen  Advice Given:  Fluids  : Drink extra fluids (Reason: to produce a dilute, nonirritating urine).  Call Back If:  You become worse.  Appointment Scheduled:  09/02/2012 11:15:00 Appointment Scheduled Provider:  Eleonore Chiquito [facility])

## 2012-09-01 NOTE — Telephone Encounter (Signed)
yes

## 2012-09-01 NOTE — Telephone Encounter (Signed)
Order done for Referral to Pulmonary.

## 2012-09-02 ENCOUNTER — Encounter: Payer: Self-pay | Admitting: Internal Medicine

## 2012-09-02 ENCOUNTER — Ambulatory Visit (INDEPENDENT_AMBULATORY_CARE_PROVIDER_SITE_OTHER): Payer: BC Managed Care – PPO | Admitting: Internal Medicine

## 2012-09-02 VITALS — BP 150/94 | HR 68 | Temp 97.7°F | Resp 18 | Wt 220.0 lb

## 2012-09-02 DIAGNOSIS — M25569 Pain in unspecified knee: Secondary | ICD-10-CM

## 2012-09-02 DIAGNOSIS — I739 Peripheral vascular disease, unspecified: Secondary | ICD-10-CM

## 2012-09-02 DIAGNOSIS — R300 Dysuria: Secondary | ICD-10-CM

## 2012-09-02 DIAGNOSIS — R350 Frequency of micturition: Secondary | ICD-10-CM

## 2012-09-02 DIAGNOSIS — M25561 Pain in right knee: Secondary | ICD-10-CM

## 2012-09-02 LAB — POCT URINALYSIS DIPSTICK
Bilirubin, UA: NEGATIVE
Blood, UA: NEGATIVE
Glucose, UA: NEGATIVE
Nitrite, UA: NEGATIVE
Spec Grav, UA: <=1.005

## 2012-09-02 MED ORDER — MELOXICAM 15 MG PO TABS: 15.0000 mg | ORAL_TABLET | Freq: Every day | ORAL | Status: DC

## 2012-09-02 MED ORDER — SULFAMETHOXAZOLE-TRIMETHOPRIM 800-160 MG PO TABS: 1.0000 | ORAL_TABLET | Freq: Two times a day (BID) | ORAL | Status: DC

## 2012-09-02 NOTE — Patient Instructions (Addendum)
Limit your sodium (Salt) intake  Call or return to clinic prn if these symptoms worsen or fail to improve as anticipated.  Vascular consultation as scheduled

## 2012-09-02 NOTE — Progress Notes (Signed)
  Subjective:    Patient ID: Eric Hoffman, male    DOB: 12-21-1940, 72 y.o.   MRN: [id number]  HPI  72 year old patient who has a history of PAD. He presents today complaining of 2 issues. He has had significant bilateral knee pain especially with repeated bending at the knees and standing. He is requesting a anti-inflammatory medication. He also has a several day history of dysuria urgency and frequency. This has not been bothersome today. A urinalysis was obtained and was normal. He states that he has had this problem intermittently in the past and usually responds to sulfa antibiotics. No fever or other complaints. No urethral discharge. He was seen in July for this similar problem and was treated with a brief course of sulfa antibiotics with improvement. He denies any obstructive urinary symptoms and feels that he ordinarily has a very good urinary flow  He is scheduled for vascular consultation later this month. He has a history of hypertension. As recommended he comes in today with his home blood pressure monitor which is a wrist cuff blood pressure readings at home are generally well controlled. There is good correlation with our office cuff and his wrist cuff with readings in the 150/95 range    Review of Systems  Genitourinary: Positive for dysuria, urgency and frequency.  Musculoskeletal: Arthralgias: bilateral knee pain.       Objective:   Physical Exam  Constitutional: He appears well-developed and well-nourished. No distress.  Musculoskeletal:       Right knee warm to touch compared to the left but no significant effusion          Assessment & Plan:   Dysuria. Symptoms today have resolved urinalysis is negative. Was given a prescription for sulfa antibiotic if symptoms recur over the weekend. He will not get this filled unless he becomes symptomatic again  PAD. Vascular followup later this month Bilateral knee pain. Probable osteoarthritis will give a trial of Mobic

## 2012-09-15 ENCOUNTER — Institutional Professional Consult (permissible substitution): Payer: BC Managed Care – PPO | Admitting: Pulmonary Disease

## 2012-09-19 ENCOUNTER — Encounter: Payer: Self-pay | Admitting: Vascular Surgery

## 2012-09-20 ENCOUNTER — Ambulatory Visit (INDEPENDENT_AMBULATORY_CARE_PROVIDER_SITE_OTHER): Payer: BC Managed Care – PPO | Admitting: Vascular Surgery

## 2012-09-20 ENCOUNTER — Encounter: Payer: Self-pay | Admitting: Vascular Surgery

## 2012-09-20 VITALS — BP 152/87 | HR 82 | Resp 18 | Ht 70.0 in | Wt 217.0 lb

## 2012-09-20 DIAGNOSIS — I739 Peripheral vascular disease, unspecified: Secondary | ICD-10-CM

## 2012-09-20 NOTE — Progress Notes (Signed)
[facility] and [facility]in Specialist of [facility]   Patient name: Eric Hoffman MRN: [id number] DOB: Oct 29, 1940 Sex: male   Referred by: Amador Cunas  Reason for referral:  Chief Complaint  Patient presents with  . New Evaluation    hx of pain in lower extremities R>L,  severe pain in bilateral knees when squatting  . 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 TF otherwise active gentleman who has lower trim the discomfort. He reports this initially was noted to have pain in his feet when walking on a trip to [place] a number of years ago. This resolved spontaneously. Now he reports pain in his ankles knees hips and lower back. He reports this is particularly severe when squatting. He demonstrated this from in my office and reports that this is specifically in his knees behind his patella. He reports that this can be a 9 on a scale of 1-10 when he is squatting. He does not really have any calf claudication type symptoms. He denies any prior history of tissue loss or rest pain. Does have a remote history of cigarette smoking. No history of stroke. No history of cardiac disease.  Past Medical History  Diagnosis Date  . Hyperlipidemia   . Hypertension   . Allergic rhinitis   . Dermatitis   . Malaria 1970 to 1982  . Anxiety   . Heart murmur   . Leg pain, bilateral   . UTI (lower urinary tract infection)     Past Surgical History  Procedure Date  . Tonsillectomy 1950  . Colonoscopy 12/2009    History   Social History  . Marital Status: Married    Spouse Name: N/A    Number of Children: N/A  . Years of Education: N/A   Occupational History  . Not on file.   Social History Main Topics  . Smoking status: Current Every Day Smoker -- 1.0 packs/day    Types: Cigarettes    Last Attempt to Quit: 08/24/2010  . Smokeless tobacco: Never Used  . Alcohol Use: 14.4 oz/week    10  Glasses of wine, 14 Shots of liquor per week  . Drug Use: No  . Sexually Active: Not Currently -- Male, Male partner(s)   Other Topics Concern  . Not on file   Social History Narrative  . No narrative on file    Family History  Problem Relation Age of Onset  . Cancer Mother     breast  . Cancer Brother     brain   . Cancer Brother     renal cancer    Allergies as of 09/20/2012 - Review Complete 09/20/2012  Allergen Reaction Noted  . Benadryl (diphenhydramine hcl)  09/20/2012  . Effexor (venlafaxine hydrochloride) Swelling 11/18/2011  . Lariam (mefloquine)  09/20/2012    Current Outpatient Prescriptions on File Prior to Visit  Medication Sig Dispense Refill  . ALPRAZolam (XANAX) 0.5 MG tablet Take 1 tablet (0.5 mg total) by mouth 3 (three) times daily as needed. For anxiety  90 tablet  3  . amLODipine (NORVASC) 5 MG tablet Take 1 tablet (5 mg total) by mouth daily.  90 tablet  3  . aspirin 81 MG EC tablet Take 1 tablet (81 mg total) by mouth daily. Swallow whole.  30 tablet  12  . atorvastatin (LIPITOR) 20 MG tablet Take 1 tablet (20 mg total) by mouth daily.  90 tablet  3  . cetirizine (ZYRTEC) 10 MG tablet Take 10 mg by mouth daily as needed. For allergies      . chlorthalidone (HYGROTON) 25 MG tablet Take 1 tablet (25 mg total) by mouth daily.  90 tablet  2  . fluticasone (FLONASE) 50 MCG/ACT nasal spray Place 2 sprays into the nose daily.  16 g  6  . sildenafil (VIAGRA) 100 MG tablet Take 100 mg by mouth daily as needed. For Erectile Dysfunction      . temazepam (RESTORIL) 15 MG capsule Take 1-2 capsules (15-30 mg total) by mouth at bedtime as needed. For sleep  60 capsule  0  . meloxicam (MOBIC) 15 MG tablet Take 1 tablet (15 mg total) by mouth daily.  30 tablet  3  . sulfamethoxazole-trimethoprim (BACTRIM DS,SEPTRA DS) 800-160 MG per tablet Take 1 tablet by mouth 2 (two) times daily.  20 tablet  0  . [DISCONTINUED] cimetidine (TAGAMET) 400 MG tablet Take 400 mg by  mouth 2 (two) times daily as needed.      . [DISCONTINUED] metoprolol succinate (TOPROL-XL) 50 MG 24 hr tablet Take 1 tablet (50 mg total) by mouth daily. Take with or immediately following a meal.  30 tablet  0     REVIEW OF SYSTEMS:  Positives indicated with an "X"  CARDIOVASCULAR:  [ ]  chest pain   [ ]  chest pressure   [ ]  palpitations   [ ]  orthopnea   [ ]  dyspnea on exertion   [ ]  claudication   [ ]  rest pain   [ ]  DVT   [ ]  phlebitis PULMONARY:   [ ]  productive cough   [ ]  asthma   [x ] wheezing NEUROLOGIC:   [x ] weakness  [ ]  paresthesias  [ ]  aphasia  [ ]  amaurosis  [ ]  dizziness HEMATOLOGIC:   [ ]  bleeding problems   [ ]  clotting disorders MUSCULOSKELETAL:  [ ]  joint pain   [ ]  joint swelling GASTROINTESTINAL: [ ]   blood in stool  [ ]   hematemesis GENITOURINARY:  [x ]  dysuria  [ ]   hematuria PSYCHIATRIC:  [x ] history of major depression INTEGUMENTARY:  [ ]  rashes  [ ]  ulcers CONSTITUTIONAL:  [ ]  fever   [ ]  chills  PHYSICAL EXAMINATION:  General: The patient is a well-nourished male, in no acute distress. Vital signs are BP 152/87  Pulse 82  Resp 18  Ht 5\' 10"  (1.778 m)  Wt 217 lb (98.431 kg)  BMI 31.14 kg/m2 Pulmonary: There is a good air exchange bilaterally without wheezing or rales. Abdomen: Soft and non-tender with normal pitch bowel sounds. Musculoskeletal: There are no major deformities.  There is no significant extremity pain. Neurologic: No focal weakness or paresthesias are detected, Skin: There are no ulcer or rashes noted. Psychiatric: The patient has normal affect. Cardiovascular: There is a regular rate and rhythm without significant murmur appreciated. Carotid arteries without bruits bilaterally Pulse status: 2+ radial and 2+ femoral pulses. Absent popliteal and distal pulses bilaterally  Vascular Lab Studies:  I have noninvasive studies from [place] from 07/25/2012. This shows an ankle arm index of 0.70 bilaterally. He does have evidence of  superficial femoral artery occlusive disease.    Impression and Plan:  Had a long discussion with patient and his wife present. He does have mild to moderate lower surety arterial occlusive disease related to superficial femoral artery occlusion. He does not have the typical claudication symptoms. I explained that none of his  hip and buttock and low back issues can be explained by this. Also his complaints are all specifically in his joints in his ankle and most particularly in his knees with squatting. I reassured him that I would not recommend any further followup or evaluation this he should have progressive symptoms. I do not feel that is the superficial artery occlusive disease is causing his symptoms. He will continue to followup with Dr.Kwiatkowski. The patient is convinced that his antihypertensive medicine are causing his symptoms.    Dione Mccombie [facility] [place] Office: [phone]

## 2012-09-28 NOTE — Progress Notes (Signed)
History and physical exam reviewed in detail. Agree with assessment and plan.  Pt refusing transport by EMS.

## 2012-11-05 ENCOUNTER — Other Ambulatory Visit: Payer: Self-pay | Admitting: Internal Medicine

## 2012-12-27 ENCOUNTER — Other Ambulatory Visit: Payer: Self-pay | Admitting: Internal Medicine

## 2013-02-07 ENCOUNTER — Other Ambulatory Visit: Payer: Self-pay | Admitting: Internal Medicine

## 2013-03-06 ENCOUNTER — Other Ambulatory Visit: Payer: Self-pay | Admitting: Internal Medicine

## 2013-03-07 ENCOUNTER — Other Ambulatory Visit: Payer: Self-pay | Admitting: *Deleted

## 2013-03-07 MED ORDER — CHLORTHALIDONE 25 MG PO TABS: 25.0000 mg | ORAL_TABLET | Freq: Every day | ORAL | Status: DC

## 2013-04-04 ENCOUNTER — Telehealth: Payer: Self-pay | Admitting: Internal Medicine

## 2013-04-04 ENCOUNTER — Other Ambulatory Visit: Payer: Self-pay | Admitting: Internal Medicine

## 2013-04-04 NOTE — Telephone Encounter (Signed)
Pt states he consulted with pharmacist yesterday in regards to his chlorthalidone (HYGROTON) 25 MG tablet pharmacist suggested pt speak with PCP about increasing dosage of this medication. Please inform pt if ok to increase dosage.

## 2013-04-04 NOTE — Telephone Encounter (Signed)
Continue present dose;  unclear of reason for consideration of titration.  Will review and discuss at next office visit Suggest return office visit if blood pressure is elevated

## 2013-04-05 NOTE — Telephone Encounter (Signed)
Spoke to pt told him to continue present dose, will review and discuss at next office visit. Suggest return office visit if blood pressure is elevated. Pt stated blood pressure is okay just having swelling in ankles and feet wanted to know if can increase medication. Told pt need to make an appointment to evaluate and discuss medication. Pt verbalized understanding and will call back has to check calendar.

## 2013-05-17 ENCOUNTER — Other Ambulatory Visit: Payer: Self-pay | Admitting: Internal Medicine

## 2013-05-19 NOTE — Telephone Encounter (Addendum)
Pt is out. Pt is going out of town today

## 2013-06-17 ENCOUNTER — Other Ambulatory Visit: Payer: Self-pay | Admitting: Internal Medicine

## 2013-07-13 ENCOUNTER — Encounter: Payer: Self-pay | Admitting: *Deleted

## 2013-07-14 ENCOUNTER — Ambulatory Visit (INDEPENDENT_AMBULATORY_CARE_PROVIDER_SITE_OTHER): Payer: BC Managed Care – PPO | Admitting: Internal Medicine

## 2013-07-14 ENCOUNTER — Other Ambulatory Visit: Payer: Self-pay | Admitting: Internal Medicine

## 2013-07-14 ENCOUNTER — Encounter: Payer: Self-pay | Admitting: Internal Medicine

## 2013-07-14 VITALS — BP 160/100 | HR 84 | Temp 97.7°F | Resp 20 | Wt 214.0 lb

## 2013-07-14 DIAGNOSIS — L259 Unspecified contact dermatitis, unspecified cause: Secondary | ICD-10-CM

## 2013-07-14 DIAGNOSIS — I10 Essential (primary) hypertension: Secondary | ICD-10-CM

## 2013-07-14 DIAGNOSIS — F411 Generalized anxiety disorder: Secondary | ICD-10-CM

## 2013-07-14 DIAGNOSIS — E785 Hyperlipidemia, unspecified: Secondary | ICD-10-CM

## 2013-07-14 DIAGNOSIS — N529 Male erectile dysfunction, unspecified: Secondary | ICD-10-CM

## 2013-07-14 DIAGNOSIS — I739 Peripheral vascular disease, unspecified: Secondary | ICD-10-CM

## 2013-07-14 DIAGNOSIS — Z23 Encounter for immunization: Secondary | ICD-10-CM

## 2013-07-14 DIAGNOSIS — Z0000 Encounter for general adult medical examination without abnormal findings: Secondary | ICD-10-CM

## 2013-07-14 DIAGNOSIS — G4700 Insomnia, unspecified: Secondary | ICD-10-CM

## 2013-07-14 LAB — CBC WITH DIFFERENTIAL/PLATELET
Basophils Absolute: 0.1 10*3/uL (ref 0.0–0.1)
Eosinophils Absolute: 0.1 10*3/uL (ref 0.0–0.7)
HCT: 47.3 % (ref 39.0–52.0)
Hemoglobin: 16.3 g/dL (ref 13.0–17.0)
Lymphs Abs: 2.1 10*3/uL (ref 0.7–4.0)
MCHC: 34.4 g/dL (ref 30.0–36.0)
Neutro Abs: 7 10*3/uL (ref 1.4–7.7)
RDW: 12.9 % (ref 11.5–14.6)

## 2013-07-14 LAB — COMPREHENSIVE METABOLIC PANEL
ALT: 24 U/L (ref 0–53)
Albumin: 4.4 g/dL (ref 3.5–5.2)
Alkaline Phosphatase: 58 U/L (ref 39–117)
BUN: 13 mg/dL (ref 6–23)
CO2: 30 mEq/L (ref 19–32)
Calcium: 9.5 mg/dL (ref 8.4–10.5)
Chloride: 98 mEq/L (ref 96–112)
Potassium: 3.2 mEq/L — ABNORMAL LOW (ref 3.5–5.1)
Sodium: 134 mEq/L — ABNORMAL LOW (ref 135–145)
Total Bilirubin: 0.9 mg/dL (ref 0.3–1.2)
Total Protein: 7.5 g/dL (ref 6.0–8.3)

## 2013-07-14 LAB — TSH: TSH: 1.05 u[IU]/mL (ref 0.35–5.50)

## 2013-07-14 LAB — TESTOSTERONE: Testosterone: 312.59 ng/dL — ABNORMAL LOW (ref 350.00–890.00)

## 2013-07-14 MED ORDER — AMOXICILLIN 500 MG PO CAPS: 500.0000 mg | ORAL_CAPSULE | Freq: Three times a day (TID) | ORAL | Status: DC

## 2013-07-14 MED ORDER — ALPRAZOLAM 1 MG PO TABS: 1.0000 mg | ORAL_TABLET | Freq: Two times a day (BID) | ORAL | Status: DC | PRN

## 2013-07-14 MED ORDER — ROSUVASTATIN CALCIUM 10 MG PO TABS: 10.0000 mg | ORAL_TABLET | Freq: Every day | ORAL | Status: DC

## 2013-07-14 MED ORDER — TEMAZEPAM 15 MG PO CAPS: ORAL_CAPSULE | ORAL | Status: DC

## 2013-07-14 NOTE — Progress Notes (Signed)
Subjective:    Patient ID: Eric Hoffman, male    DOB: 1941/07/26, 72 y.o.   MRN: [id number]  HPI  72 year old patient who has dyslipidemia. He has peripheral vascular disease but has not tolerated atorvastatin or simvastatin. The latter caused significant neck stiffness and atorvastatin caused low back pain and stiffness. He complains of increasing anxiety and is requesting an increase in alprazolam dosing. He has treated hypertension and presently is on chlorthalidone only home blood pressure readings have been in a low normal range. He is followed by [facility] orthopedics. He complains of chronic insomnia. He no longer drinks alcohol.  Past Medical History  Diagnosis Date  . Hyperlipidemia   . Hypertension   . Allergic rhinitis   . Dermatitis   . Malaria 1970 to 1982  . Anxiety   . Heart murmur   . Leg pain, bilateral   . UTI (lower urinary tract infection)     History   Social History  . Marital Status: Married    Spouse Name: N/A    Number of Children: N/A  . Years of Education: N/A   Occupational History  . Not on file.   Social History Main Topics  . Smoking status: Current Every Day Smoker -- 1.00 packs/day    Types: Cigarettes    Last Attempt to Quit: 08/24/2010  . Smokeless tobacco: Never Used  . Alcohol Use: 14.4 oz/week    10 Glasses of wine, 14 Shots of liquor per week  . Drug Use: No  . Sexual Activity: Not Currently    Partners: Female, Male   Other Topics Concern  . Not on file   Social History Narrative  . No narrative on file    Past Surgical History  Procedure Laterality Date  . Tonsillectomy  1950  . Colonoscopy  12/2009    Family History  Problem Relation Age of Onset  . Cancer Mother     breast  . Cancer Brother     brain   . Cancer Brother     renal cancer    Allergies  Allergen Reactions  . Benadryl [Diphenhydramine Hcl]     Hyperactivity,  Difficulty breathing, difficulty swallowing  . Effexor [Venlafaxine Hydrochloride]  Swelling    Of throat  . Lariam [Mefloquine]     Suicidal ideation    Current Outpatient Prescriptions on File Prior to Visit  Medication Sig Dispense Refill  . ALPRAZolam (XANAX) 0.5 MG tablet TAKE 1 TABLET BY MOUTH THREE TIMES DAILY AS NEEDED  90 tablet  1  . aspirin 81 MG EC tablet Take 1 tablet (81 mg total) by mouth daily. Swallow whole.  30 tablet  12  . cetirizine (ZYRTEC) 10 MG tablet Take 10 mg by mouth daily as needed. For allergies      . chlorthalidone (HYGROTON) 25 MG tablet Take 1 tablet (25 mg total) by mouth daily.  90 tablet  3  . fluticasone (FLONASE) 50 MCG/ACT nasal spray Place 2 sprays into the nose daily.  16 g  6  . meloxicam (MOBIC) 15 MG tablet Take 1 tablet (15 mg total) by mouth daily.  30 tablet  3  . sildenafil (VIAGRA) 100 MG tablet Take 100 mg by mouth daily as needed. For Erectile Dysfunction      . temazepam (RESTORIL) 15 MG capsule TAKE 1 OR 2 CAPSULES BY MOUTH AT BEDTIME AS NEEDED FOR SLEEP  60 capsule  1  . VIAGRA 100 MG tablet TAKE 1 TABLET (100 MG TOTAL) BY  MOUTH AS DIRECTED.  4 tablet  5  . amLODipine (NORVASC) 5 MG tablet Take 1 tablet (5 mg total) by mouth daily.  90 tablet  3  . atorvastatin (LIPITOR) 20 MG tablet Take 1 tablet (20 mg total) by mouth daily.  90 tablet  3  . [DISCONTINUED] cimetidine (TAGAMET) 400 MG tablet Take 400 mg by mouth 2 (two) times daily as needed.      . [DISCONTINUED] metoprolol succinate (TOPROL-XL) 50 MG 24 hr tablet Take 1 tablet (50 mg total) by mouth daily. Take with or immediately following a meal.  30 tablet  0   No current facility-administered medications on file prior to visit.    BP 160/100  Pulse 84  Temp(Src) 97.7 F (36.5 C) (Oral)  Resp 20  Wt 214 lb (97.07 kg)  SpO2 98%      Review of Systems  Constitutional: Negative for fever, chills, appetite change and fatigue.  HENT: Negative for congestion, dental problem, ear pain, hearing loss, sore throat, tinnitus, trouble swallowing and voice  change.   Eyes: Negative for pain, discharge and visual disturbance.  Respiratory: Negative for cough, chest tightness, wheezing and stridor.   Cardiovascular: Negative for chest pain, palpitations and leg swelling.  Gastrointestinal: Negative for nausea, vomiting, abdominal pain, diarrhea, constipation, blood in stool and abdominal distention.  Genitourinary: Negative for urgency, hematuria, flank pain, discharge, difficulty urinating and genital sores.  Musculoskeletal: Positive for back pain and neck stiffness. Negative for arthralgias, gait problem, joint swelling and myalgias.  Skin: Negative for rash.  Neurological: Negative for dizziness, syncope, speech difficulty, weakness, numbness and headaches.  Hematological: Negative for adenopathy. Does not bruise/bleed easily.  Psychiatric/Behavioral: Positive for sleep disturbance and decreased concentration. Negative for behavioral problems and dysphoric mood. The patient is nervous/anxious.        Objective:   Physical Exam  Constitutional: He is oriented to person, place, and time. He appears well-developed.  Repeat blood pressure 130/84  HENT:  Head: Normocephalic.  Right Ear: External ear normal.  Left Ear: External ear normal.  Eyes: Conjunctivae and EOM are normal.  Neck: Normal range of motion.  Cardiovascular: Normal rate.   Murmur heard. Pulmonary/Chest: Breath sounds normal.  Abdominal: Bowel sounds are normal.  Musculoskeletal: Normal range of motion. He exhibits no edema and no tenderness.  Neurological: He is alert and oriented to person, place, and time.  Psychiatric: He has a normal mood and affect. His behavior is normal.          Assessment & Plan:   Hypertension. Probable reasonable control on diuretic therapy alone  Peripheral vascular disease. Will give a trial of Crestor 10 mg twice weekly  Dyslipidemia  Chronic anxiety /insomnia. All medications refilled   CPX 6 months

## 2013-07-14 NOTE — Patient Instructions (Signed)
Limit your sodium (Salt) intake  Please check your blood pressure on a regular basis.  If it is consistently greater than 150/90, please make an office appointment.    It is important that you exercise regularly, at least 20 minutes 3 to 4 times per week.  If you develop chest pain or shortness of breath seek  medical attention.  Return in 6 months for follow-up  

## 2013-07-17 ENCOUNTER — Telehealth: Payer: Self-pay | Admitting: Internal Medicine

## 2013-07-17 DIAGNOSIS — E785 Hyperlipidemia, unspecified: Secondary | ICD-10-CM

## 2013-07-17 MED ORDER — ALPRAZOLAM 1 MG PO TABS: 1.0000 mg | ORAL_TABLET | Freq: Three times a day (TID) | ORAL | Status: DC | PRN

## 2013-07-17 MED ORDER — POTASSIUM CHLORIDE CRYS ER 20 MEQ PO TBCR: 20.0000 meq | EXTENDED_RELEASE_TABLET | Freq: Every day | ORAL | Status: DC

## 2013-07-17 NOTE — Telephone Encounter (Signed)
Spoke to pt he said his Alprazolam was increased in dose but decreased to twice a day instead of three times a day like he was taking. Pt would like to take medication three times a day. Also pt would like Lipid panel checked being as it was not done with recent labs.Told pt I would check with Dr.K and get back to him. Pt verbalized understanding.

## 2013-07-17 NOTE — Telephone Encounter (Signed)
Spoke to pt said his Alprazolam was increased in dose but decreased to twice a day instead of three times a day. Pt said he would like to take the medication

## 2013-07-17 NOTE — Telephone Encounter (Signed)
TID dosing change OK; Fasting lipid panel OK

## 2013-07-17 NOTE — Telephone Encounter (Signed)
Spoke to pt told him new Rx for Alprazolam and K-dur sent to pharmacy and lab order done for Lipid panel just need to call back to schedule lab appt only. Pt verbalized understanding.

## 2013-07-17 NOTE — Telephone Encounter (Signed)
Pt has several questions about his meds and the instructions he was given on Fri, Pt would also like the results of his labs done Friday. Pt also has a question about his rx for ALPRAZolam (XANAX) 1 MG tablet. He had asked for an increase in this med. Dr Kirtland Bouchard increased the amount but went down on the frequency to 2 x day. Pt would like to stay on this med 3 x /day w/ an increase.

## 2013-07-28 ENCOUNTER — Other Ambulatory Visit: Payer: Self-pay | Admitting: Internal Medicine

## 2013-08-09 ENCOUNTER — Encounter: Payer: Self-pay | Admitting: Internal Medicine

## 2013-08-09 ENCOUNTER — Telehealth: Payer: Self-pay | Admitting: Internal Medicine

## 2013-08-09 NOTE — Telephone Encounter (Signed)
Pt had flu shot here

## 2013-08-09 NOTE — Telephone Encounter (Signed)
Noted  

## 2013-08-14 ENCOUNTER — Other Ambulatory Visit: Payer: BC Managed Care – PPO

## 2013-08-14 ENCOUNTER — Other Ambulatory Visit (INDEPENDENT_AMBULATORY_CARE_PROVIDER_SITE_OTHER): Payer: BC Managed Care – PPO

## 2013-08-14 DIAGNOSIS — E785 Hyperlipidemia, unspecified: Secondary | ICD-10-CM

## 2013-08-14 LAB — LIPID PANEL
Cholesterol: 294 mg/dL — ABNORMAL HIGH (ref 0–200)
HDL: 41.2 mg/dL (ref >39.00)
Triglycerides: 210 mg/dL — ABNORMAL HIGH (ref 0.0–149.0)

## 2013-08-15 LAB — LDL CHOLESTEROL, DIRECT: Direct LDL: 245.2 mg/dL

## 2013-10-03 ENCOUNTER — Other Ambulatory Visit: Payer: Self-pay | Admitting: Internal Medicine

## 2013-10-27 ENCOUNTER — Telehealth: Payer: Self-pay | Admitting: Internal Medicine

## 2013-10-27 NOTE — Telephone Encounter (Signed)
Spoke to pt told him to go ahead an stop medication due to side effects and make an appointment to see Dr.K when he returns. Told pt I can not order blood test, will have to wait to see Dr. Raliegh Ip. Pt verbalized understanding.

## 2013-10-27 NOTE — Telephone Encounter (Signed)
Pt is having side effects including pain from taking crestor.  Pt would like another blood test to be done for his cholesterol and then set up an appointment with Dr. Raliegh Ip to discuss his symptoms and his lab results.  Please advise.

## 2013-10-30 ENCOUNTER — Other Ambulatory Visit: Payer: Self-pay | Admitting: Internal Medicine

## 2013-10-31 ENCOUNTER — Telehealth: Payer: Self-pay | Admitting: Internal Medicine

## 2013-10-31 DIAGNOSIS — M791 Myalgia, unspecified site: Secondary | ICD-10-CM

## 2013-10-31 NOTE — Telephone Encounter (Signed)
Patient Information:  Caller Name: [place]  Phone: [phone]  Patient: Eric Hoffman, Eric Hoffman  Gender: Male  DOB: Sep 03, 1940  Age: 73 Years  PCP: Bluford Kaufmann (Family Practice > 54yrs old)  Office Follow Up:  Does the office need to follow up with this patient?: Yes  Instructions For The Office: He is requsting permission to see Orthopedic, Dr. Nelva Bush. Please advise. (Pains/aches since starting Crestor 7 weeks ago and was seen in our office for foot pain while he was in [place] and had some other medications, adjusted).  RN Note:  Onset recently, not exactly sure of date, pain medication, Ibuprofen, needed for any activity like walking dog or emptying the dishwasher.  After walking the dog, hip pain, bending to empty the dishwasher back pain. All the muscles in hips, back, ankles are causing musclepain and needs Ibuprofen, which helps but he is usually not used to having any pain.  He is questioning if the Crestor medication causing MUSCLE aches and would like a referral to Orthopedic  Dr. Nelva Bush. Please advise. He would like the Orthopedic appointment with Dr. Nelva Bush since his wife used him. He checks his blood pressure three times a day 130/85, lunch time usually 120/75 and in the evening 110/65. 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  In the past Dr. stated to stop taking the BP medications and keep monitoring and report back if bp rises. He has been taking Crestor for the last 7 weeks and not sure if these aches are from that or if he has a more serious issue. Today, 10/31/2013 he does not have any aches at the moment but has regained weight again- 210-220lb which is what happened last time he needed a medication change.  Please call today, 10/31/2013 and advise if ok to have a referral to Dr. Nelva Bush.;  Symptoms  Reason For Call & Symptoms: Went ot [place] and feet really hurt walking, "Pains in the feet".  Reviewed Health History In EMR: Yes  Reviewed Medications In  EMR: Yes  Reviewed Allergies In EMR: Yes  Reviewed Surgeries / Procedures: Yes  Date of Onset of Symptoms: 10/31/2013  Guideline(s) Used:  No Protocol Available - Sick Adult  Disposition Per Guideline:   Discuss with PCP and Callback by Nurse Today  Reason For Disposition Reached:   Nursing judgment  Advice Given:  Call Back If:  New symptoms develop  You become worse.  Patient Will Follow Care Advice:  YES

## 2013-11-06 NOTE — Telephone Encounter (Signed)
Spoke to pt told him order done for referral to Ortho. Pt said he already has an appointment and did not need a referral. Told him okay will cancel order. Pt verbalized understanding.

## 2013-11-06 NOTE — Telephone Encounter (Signed)
Okay to do referral to Orthopedics?

## 2013-11-06 NOTE — Telephone Encounter (Signed)
Ok for referral?

## 2013-12-05 ENCOUNTER — Ambulatory Visit (INDEPENDENT_AMBULATORY_CARE_PROVIDER_SITE_OTHER): Payer: BC Managed Care – PPO | Admitting: Internal Medicine

## 2013-12-05 ENCOUNTER — Telehealth: Payer: Self-pay | Admitting: Internal Medicine

## 2013-12-05 ENCOUNTER — Encounter: Payer: Self-pay | Admitting: Internal Medicine

## 2013-12-05 VITALS — BP 166/100 | HR 86 | Temp 98.7°F | Resp 20 | Ht 70.0 in | Wt 220.0 lb

## 2013-12-05 DIAGNOSIS — F17200 Nicotine dependence, unspecified, uncomplicated: Secondary | ICD-10-CM

## 2013-12-05 DIAGNOSIS — E785 Hyperlipidemia, unspecified: Secondary | ICD-10-CM

## 2013-12-05 DIAGNOSIS — I739 Peripheral vascular disease, unspecified: Secondary | ICD-10-CM

## 2013-12-05 DIAGNOSIS — J309 Allergic rhinitis, unspecified: Secondary | ICD-10-CM

## 2013-12-05 DIAGNOSIS — I10 Essential (primary) hypertension: Secondary | ICD-10-CM

## 2013-12-05 DIAGNOSIS — Z720 Tobacco use: Secondary | ICD-10-CM

## 2013-12-05 MED ORDER — SIMVASTATIN 20 MG PO TABS: 20.0000 mg | ORAL_TABLET | Freq: Every day | ORAL | Status: AC

## 2013-12-05 MED ORDER — PREDNISONE 20 MG PO TABS: 20.0000 mg | ORAL_TABLET | Freq: Two times a day (BID) | ORAL | Status: AC

## 2013-12-05 NOTE — Progress Notes (Signed)
Pre-visit discussion using our clinic review tool. No additional management support is needed unless otherwise documented below in the visit note.  

## 2013-12-05 NOTE — Patient Instructions (Signed)
Limit your sodium (Salt) intake    It is important that you exercise regularly, at least 20 minutes 3 to 4 times per week.  If you develop chest pain or shortness of breath seek  medical attention.  Please check your blood pressure on a regular basis.  If it is consistently greater than 150/90, please make an office appointment.  Return in 6 months for follow-up   

## 2013-12-05 NOTE — Progress Notes (Signed)
Subjective:    Patient ID: Eric Hoffman, male    DOB: [birth year], 73 y.o.   MRN: [id number]  HPI  73 year old patient who has hypertension, dyslipidemia, and PAD.  He has been intolerant of atorvastatin and was placed on Crestor last visit that he tolerated poorly.  He states today that he has been on simvastatin for greater than 20 years and tolerated dose of 20 mg. He monitors home blood pressure readings 3 times daily with nice readings. At the present time.  He is on chlorthalidone only for blood pressure control. He complains of significant allergy-related symptoms, which he states, are very common in the fall, and the spring  Past Medical History  Diagnosis Date  . Hyperlipidemia   . Hypertension   . Allergic rhinitis   . Dermatitis   . Malaria 1970 to 1982  . Anxiety   . Heart murmur   . Leg pain, bilateral   . UTI (lower urinary tract infection)     History   Social History  . Marital Status: Married    Spouse Name: N/A    Number of Children: N/A  . Years of Education: N/A   Occupational History  . Not on file.   Social History Main Topics  . Smoking status: Current Every Day Smoker -- 1.00 packs/day    Types: Cigarettes    Last Attempt to Quit: 08/24/2010  . Smokeless tobacco: Never Used  . Alcohol Use: 14.4 oz/week    10 Glasses of wine, 14 Shots of liquor per week  . Drug Use: No  . Sexual Activity: Not Currently    Partners: Female, Male   Other Topics Concern  . Not on file   Social History Narrative  . No narrative on file    Past Surgical History  Procedure Laterality Date  . Tonsillectomy  1950  . Colonoscopy  12/2009    Family History  Problem Relation Age of Onset  . Cancer Mother     breast  . Cancer Brother     brain   . Cancer Brother     renal cancer    Allergies  Allergen Reactions  . Benadryl [Diphenhydramine Hcl]     Hyperactivity,  Difficulty breathing, difficulty swallowing  . Effexor [Venlafaxine Hydrochloride]  Swelling    Of throat  . Lariam [Mefloquine]     Suicidal ideation    Current Outpatient Prescriptions on File Prior to Visit  Medication Sig Dispense Refill  . ALPRAZolam (XANAX) 1 MG tablet TAKE 1 TABLET BY MOUTH THREE TIMES DAILY AS NEEDED  90 tablet  2  . cetirizine (ZYRTEC) 10 MG tablet Take 10 mg by mouth daily as needed. For allergies      . chlorthalidone (HYGROTON) 25 MG tablet Take 1 tablet (25 mg total) by mouth daily.  90 tablet  3  . fluticasone (FLONASE) 50 MCG/ACT nasal spray Place 2 sprays into the nose daily.  16 g  6  . meloxicam (MOBIC) 15 MG tablet TAKE 1 TABLET (15 MG TOTAL) BY MOUTH DAILY.  30 tablet  2  . potassium chloride SA (K-DUR,KLOR-CON) 20 MEQ tablet Take 1 tablet (20 mEq total) by mouth daily.  30 tablet  3  . sildenafil (VIAGRA) 100 MG tablet Take 100 mg by mouth daily as needed. For Erectile Dysfunction      . temazepam (RESTORIL) 15 MG capsule TAKE ONE OR TWO CAPSULES BY MOUTH AT BEDTIME AS NEEDED FOR SLEEP  60 capsule  2  .  VIAGRA 100 MG tablet TAKE 1 TABLET (100 MG TOTAL) BY MOUTH AS DIRECTED.  4 tablet  5  . [DISCONTINUED] cimetidine (TAGAMET) 400 MG tablet Take 400 mg by mouth 2 (two) times daily as needed.      . [DISCONTINUED] metoprolol succinate (TOPROL-XL) 50 MG 24 hr tablet Take 1 tablet (50 mg total) by mouth daily. Take with or immediately following a meal.  30 tablet  0   No current facility-administered medications on file prior to visit.    BP 166/100  Pulse 86  Temp(Src) 98.7 F (37.1 C) (Oral)  Resp 20  Ht 5\' 10"  (1.778 m)  Wt 220 lb (99.791 kg)  BMI 31.57 kg/m2  SpO2 98%       Review of Systems  Constitutional: Negative for fever, chills, appetite change and fatigue.  HENT: Positive for congestion and rhinorrhea. Negative for dental problem, ear pain, hearing loss, sore throat, tinnitus, trouble swallowing and voice change.   Eyes: Positive for itching. Negative for pain, discharge and visual disturbance.  Respiratory:  Negative for cough, chest tightness, wheezing and stridor.   Cardiovascular: Negative for chest pain, palpitations and leg swelling.  Gastrointestinal: Negative for nausea, vomiting, abdominal pain, diarrhea, constipation, blood in stool and abdominal distention.  Genitourinary: Negative for urgency, hematuria, flank pain, discharge, difficulty urinating and genital sores.  Musculoskeletal: Negative for arthralgias, back pain, gait problem, joint swelling, myalgias and neck stiffness.  Skin: Negative for rash.  Neurological: Negative for dizziness, syncope, speech difficulty, weakness, numbness and headaches.  Hematological: Negative for adenopathy. Does not bruise/bleed easily.  Psychiatric/Behavioral: Negative for behavioral problems and dysphoric mood. The patient is not nervous/anxious.        Objective:   Physical Exam  Constitutional: He is oriented to person, place, and time. He appears well-developed.  Repeat blood pressure down to 140 over 80  HENT:  Head: Normocephalic.  Right Ear: External ear normal.  Left Ear: External ear normal.  Eyes: EOM are normal.  Conjunctival injection  Neck: Normal range of motion.  Cardiovascular: Normal rate and normal heart sounds.   Pulmonary/Chest: Breath sounds normal. He has no wheezes. He has no rales.  Abdominal: Bowel sounds are normal.  Musculoskeletal: Normal range of motion. He exhibits no edema and no tenderness.  Neurological: He is alert and oriented to person, place, and time.  Psychiatric: He has a normal mood and affect. His behavior is normal.          Assessment & Plan:   Hypertension.  Continue present regimen Allergic rhinitis.  We'll treat with a modest dose of short term prednisone Dyslipidemia/PAD.  Patient states that he has tolerated low intensity, simvastatin in the, past.  We'll resume 20 mg daily  Recheck 6 months Total smoking cessation encouraged

## 2013-12-05 NOTE — Telephone Encounter (Signed)
Relevant patient education assigned to patient using Emmi. ° °

## 2013-12-06 ENCOUNTER — Telehealth: Payer: Self-pay | Admitting: Internal Medicine

## 2013-12-06 NOTE — Telephone Encounter (Signed)
Relevant patient education assigned to patient using Emmi. ° °

## 2013-12-16 ENCOUNTER — Other Ambulatory Visit: Payer: Self-pay | Admitting: Internal Medicine

## 2013-12-21 ENCOUNTER — Telehealth: Payer: Self-pay | Admitting: Internal Medicine

## 2013-12-21 DIAGNOSIS — R300 Dysuria: Secondary | ICD-10-CM

## 2013-12-21 NOTE — Telephone Encounter (Signed)
Pt req antibiotic for UTI  Pharmacy [facility] [address]

## 2013-12-21 NOTE — Telephone Encounter (Signed)
Needs a U/A- no ROV required to document UTI

## 2013-12-21 NOTE — Telephone Encounter (Signed)
Spoke to pt c/o burning with urination and difficulty urinating, pushed fluids yesterday and took Azo which has helped but requesting antibiotic. Please advise

## 2013-12-21 NOTE — Telephone Encounter (Signed)
Spoke to pt told him need to come into the lab to give a urine specimen per Dr. Raliegh Ip. Pt verbalized understanding and stated can not come in today. Told him okay I will put order in and just try to come in tomorrow. Pt verbalized understanding.

## 2014-01-10 ENCOUNTER — Other Ambulatory Visit: Payer: Self-pay | Admitting: Internal Medicine

## 2014-02-08 ENCOUNTER — Other Ambulatory Visit: Payer: Self-pay | Admitting: Internal Medicine

## 2014-02-10 ENCOUNTER — Other Ambulatory Visit: Payer: Self-pay | Admitting: Internal Medicine

## 2014-02-11 ENCOUNTER — Other Ambulatory Visit: Payer: Self-pay | Admitting: Internal Medicine

## 2014-02-13 ENCOUNTER — Telehealth: Payer: Self-pay | Admitting: Internal Medicine

## 2014-02-13 MED ORDER — ALPRAZOLAM 1 MG PO TABS: ORAL_TABLET | ORAL | Status: DC

## 2014-02-13 NOTE — Telephone Encounter (Signed)
Rx called in to pharmacy. 

## 2014-02-13 NOTE — Telephone Encounter (Signed)
Pt needs refill on alprazolam call into [facility] [address]

## 2014-04-02 ENCOUNTER — Other Ambulatory Visit: Payer: Self-pay | Admitting: Internal Medicine

## 2014-04-11 ENCOUNTER — Other Ambulatory Visit: Payer: Self-pay | Admitting: Internal Medicine

## 2014-04-21 ENCOUNTER — Other Ambulatory Visit: Payer: Self-pay | Admitting: Internal Medicine

## 2014-07-02 ENCOUNTER — Other Ambulatory Visit: Payer: Self-pay | Admitting: Internal Medicine

## 2014-11-10 ENCOUNTER — Other Ambulatory Visit: Payer: Self-pay | Admitting: Internal Medicine

## 2014-12-10 ENCOUNTER — Other Ambulatory Visit: Payer: Self-pay | Admitting: Internal Medicine

## 2015-02-19 ENCOUNTER — Other Ambulatory Visit: Payer: Self-pay | Admitting: Internal Medicine

## 2015-03-05 ENCOUNTER — Other Ambulatory Visit: Payer: Self-pay | Admitting: Family Medicine

## 2015-03-05 DIAGNOSIS — I779 Disorder of arteries and arterioles, unspecified: Secondary | ICD-10-CM

## 2015-03-05 DIAGNOSIS — R5383 Other fatigue: Secondary | ICD-10-CM | POA: Diagnosis not present

## 2015-03-05 DIAGNOSIS — F419 Anxiety disorder, unspecified: Secondary | ICD-10-CM | POA: Diagnosis not present

## 2015-03-05 DIAGNOSIS — I200 Unstable angina: Secondary | ICD-10-CM | POA: Diagnosis not present

## 2015-03-05 DIAGNOSIS — I10 Essential (primary) hypertension: Secondary | ICD-10-CM | POA: Diagnosis not present

## 2015-03-05 DIAGNOSIS — I739 Peripheral vascular disease, unspecified: Secondary | ICD-10-CM | POA: Diagnosis not present

## 2015-03-05 DIAGNOSIS — R609 Edema, unspecified: Secondary | ICD-10-CM | POA: Diagnosis not present

## 2015-03-05 DIAGNOSIS — M109 Gout, unspecified: Secondary | ICD-10-CM | POA: Diagnosis not present

## 2015-03-05 DIAGNOSIS — R011 Cardiac murmur, unspecified: Secondary | ICD-10-CM | POA: Diagnosis not present

## 2015-03-07 ENCOUNTER — Other Ambulatory Visit: Payer: Self-pay

## 2015-03-08 ENCOUNTER — Ambulatory Visit
Admission: RE | Admit: 2015-03-08 | Discharge: 2015-03-08 | Disposition: A | Discharge status: Home / Self Care | Payer: Medicare Other | Source: Ambulatory Visit | Attending: Family Medicine | Admitting: Family Medicine

## 2015-03-08 ENCOUNTER — Other Ambulatory Visit: Payer: Self-pay | Admitting: Family Medicine

## 2015-03-08 DIAGNOSIS — I779 Disorder of arteries and arterioles, unspecified: Secondary | ICD-10-CM

## 2015-03-13 ENCOUNTER — Ambulatory Visit
Admission: RE | Admit: 2015-03-13 | Discharge: 2015-03-13 | Disposition: A | Discharge status: Home / Self Care | Payer: Medicare Other | Source: Ambulatory Visit | Attending: Family Medicine | Admitting: Family Medicine

## 2015-03-13 DIAGNOSIS — I739 Peripheral vascular disease, unspecified: Secondary | ICD-10-CM | POA: Diagnosis not present

## 2015-03-19 DIAGNOSIS — E780 Pure hypercholesterolemia: Secondary | ICD-10-CM | POA: Diagnosis not present

## 2015-03-19 DIAGNOSIS — I739 Peripheral vascular disease, unspecified: Secondary | ICD-10-CM | POA: Diagnosis not present

## 2015-03-19 DIAGNOSIS — R011 Cardiac murmur, unspecified: Secondary | ICD-10-CM | POA: Diagnosis not present

## 2015-03-19 DIAGNOSIS — I10 Essential (primary) hypertension: Secondary | ICD-10-CM | POA: Diagnosis not present

## 2015-03-26 DIAGNOSIS — R011 Cardiac murmur, unspecified: Secondary | ICD-10-CM | POA: Diagnosis not present

## 2015-03-26 DIAGNOSIS — I10 Essential (primary) hypertension: Secondary | ICD-10-CM | POA: Diagnosis not present

## 2015-03-26 DIAGNOSIS — I739 Peripheral vascular disease, unspecified: Secondary | ICD-10-CM | POA: Diagnosis not present

## 2015-03-26 DIAGNOSIS — L853 Xerosis cutis: Secondary | ICD-10-CM | POA: Diagnosis not present

## 2015-04-10 DIAGNOSIS — M1A09X0 Idiopathic chronic gout, multiple sites, without tophus (tophi): Secondary | ICD-10-CM | POA: Diagnosis not present

## 2015-04-10 DIAGNOSIS — M545 Low back pain: Secondary | ICD-10-CM | POA: Diagnosis not present

## 2015-04-10 DIAGNOSIS — M5136 Other intervertebral disc degeneration, lumbar region: Secondary | ICD-10-CM | POA: Diagnosis not present

## 2015-04-10 DIAGNOSIS — R011 Cardiac murmur, unspecified: Secondary | ICD-10-CM | POA: Diagnosis not present

## 2015-04-10 DIAGNOSIS — M2550 Pain in unspecified joint: Secondary | ICD-10-CM | POA: Diagnosis not present

## 2015-04-19 DIAGNOSIS — M5136 Other intervertebral disc degeneration, lumbar region: Secondary | ICD-10-CM | POA: Diagnosis not present

## 2015-04-19 DIAGNOSIS — M545 Low back pain: Secondary | ICD-10-CM | POA: Diagnosis not present

## 2015-04-19 DIAGNOSIS — L401 Generalized pustular psoriasis: Secondary | ICD-10-CM | POA: Diagnosis not present

## 2015-04-19 DIAGNOSIS — M2550 Pain in unspecified joint: Secondary | ICD-10-CM | POA: Diagnosis not present

## 2015-04-19 DIAGNOSIS — M1A09X0 Idiopathic chronic gout, multiple sites, without tophus (tophi): Secondary | ICD-10-CM | POA: Diagnosis not present

## 2015-04-19 DIAGNOSIS — R21 Rash and other nonspecific skin eruption: Secondary | ICD-10-CM | POA: Diagnosis not present

## 2015-04-30 DIAGNOSIS — I10 Essential (primary) hypertension: Secondary | ICD-10-CM | POA: Diagnosis not present

## 2015-04-30 DIAGNOSIS — R5383 Other fatigue: Secondary | ICD-10-CM | POA: Diagnosis not present

## 2015-05-01 DIAGNOSIS — I739 Peripheral vascular disease, unspecified: Secondary | ICD-10-CM | POA: Diagnosis not present

## 2015-05-01 DIAGNOSIS — F339 Major depressive disorder, recurrent, unspecified: Secondary | ICD-10-CM | POA: Diagnosis not present

## 2015-05-01 DIAGNOSIS — I10 Essential (primary) hypertension: Secondary | ICD-10-CM | POA: Diagnosis not present

## 2015-05-01 DIAGNOSIS — M109 Gout, unspecified: Secondary | ICD-10-CM | POA: Diagnosis not present

## 2015-05-14 ENCOUNTER — Ambulatory Visit (HOSPITAL_COMMUNITY): Admission: RE | Admit: 2015-05-14 | Payer: Medicare Other | Source: Ambulatory Visit | Admitting: Cardiology

## 2015-05-14 ENCOUNTER — Encounter (HOSPITAL_COMMUNITY): Admission: RE | Payer: Self-pay | Source: Ambulatory Visit

## 2015-05-14 SURGERY — LOWER EXTREMITY ANGIOGRAPHY

## 2015-05-22 DIAGNOSIS — J329 Chronic sinusitis, unspecified: Secondary | ICD-10-CM | POA: Diagnosis not present

## 2015-05-22 DIAGNOSIS — I739 Peripheral vascular disease, unspecified: Secondary | ICD-10-CM | POA: Diagnosis not present

## 2015-05-22 DIAGNOSIS — H43399 Other vitreous opacities, unspecified eye: Secondary | ICD-10-CM | POA: Diagnosis not present

## 2015-05-22 DIAGNOSIS — R05 Cough: Secondary | ICD-10-CM | POA: Diagnosis not present

## 2015-05-23 ENCOUNTER — Other Ambulatory Visit: Payer: Self-pay | Admitting: Family Medicine

## 2015-05-23 DIAGNOSIS — I739 Peripheral vascular disease, unspecified: Secondary | ICD-10-CM

## 2015-05-23 DIAGNOSIS — G459 Transient cerebral ischemic attack, unspecified: Secondary | ICD-10-CM | POA: Diagnosis not present

## 2015-05-27 ENCOUNTER — Ambulatory Visit
Admission: RE | Admit: 2015-05-27 | Discharge: 2015-05-27 | Disposition: A | Discharge status: Home / Self Care | Payer: Medicare Other | Source: Ambulatory Visit | Attending: Family Medicine | Admitting: Family Medicine

## 2015-05-27 ENCOUNTER — Other Ambulatory Visit: Payer: Medicare Other

## 2015-05-27 DIAGNOSIS — I6523 Occlusion and stenosis of bilateral carotid arteries: Secondary | ICD-10-CM | POA: Diagnosis not present

## 2015-05-27 DIAGNOSIS — I739 Peripheral vascular disease, unspecified: Secondary | ICD-10-CM

## 2015-05-28 DIAGNOSIS — G459 Transient cerebral ischemic attack, unspecified: Secondary | ICD-10-CM | POA: Diagnosis not present

## 2015-05-28 DIAGNOSIS — I739 Peripheral vascular disease, unspecified: Secondary | ICD-10-CM | POA: Diagnosis not present

## 2015-05-28 DIAGNOSIS — I10 Essential (primary) hypertension: Secondary | ICD-10-CM | POA: Diagnosis not present

## 2015-05-28 DIAGNOSIS — I350 Nonrheumatic aortic (valve) stenosis: Secondary | ICD-10-CM | POA: Diagnosis not present

## 2015-06-03 DIAGNOSIS — I779 Disorder of arteries and arterioles, unspecified: Secondary | ICD-10-CM | POA: Diagnosis not present

## 2015-06-03 DIAGNOSIS — I6521 Occlusion and stenosis of right carotid artery: Secondary | ICD-10-CM | POA: Diagnosis not present

## 2015-06-11 DIAGNOSIS — I739 Peripheral vascular disease, unspecified: Secondary | ICD-10-CM | POA: Diagnosis not present

## 2015-06-11 DIAGNOSIS — Z23 Encounter for immunization: Secondary | ICD-10-CM | POA: Diagnosis not present

## 2015-06-11 DIAGNOSIS — R05 Cough: Secondary | ICD-10-CM | POA: Diagnosis not present

## 2015-06-11 DIAGNOSIS — I10 Essential (primary) hypertension: Secondary | ICD-10-CM | POA: Diagnosis not present

## 2015-06-13 DIAGNOSIS — I779 Disorder of arteries and arterioles, unspecified: Secondary | ICD-10-CM | POA: Diagnosis not present

## 2015-06-13 DIAGNOSIS — Z7902 Long term (current) use of antithrombotics/antiplatelets: Secondary | ICD-10-CM | POA: Diagnosis not present

## 2015-06-13 DIAGNOSIS — I6523 Occlusion and stenosis of bilateral carotid arteries: Secondary | ICD-10-CM | POA: Diagnosis not present

## 2015-06-13 DIAGNOSIS — I6522 Occlusion and stenosis of left carotid artery: Secondary | ICD-10-CM | POA: Diagnosis not present

## 2015-06-13 DIAGNOSIS — Z7982 Long term (current) use of aspirin: Secondary | ICD-10-CM | POA: Diagnosis not present

## 2015-06-17 DIAGNOSIS — I739 Peripheral vascular disease, unspecified: Secondary | ICD-10-CM | POA: Diagnosis not present

## 2015-06-17 DIAGNOSIS — Z7982 Long term (current) use of aspirin: Secondary | ICD-10-CM | POA: Diagnosis not present

## 2015-06-17 DIAGNOSIS — L4050 Arthropathic psoriasis, unspecified: Secondary | ICD-10-CM | POA: Diagnosis not present

## 2015-06-17 DIAGNOSIS — I4949 Other premature depolarization: Secondary | ICD-10-CM | POA: Diagnosis not present

## 2015-06-17 DIAGNOSIS — Z01810 Encounter for preprocedural cardiovascular examination: Secondary | ICD-10-CM | POA: Diagnosis not present

## 2015-06-17 DIAGNOSIS — Z01818 Encounter for other preprocedural examination: Secondary | ICD-10-CM | POA: Diagnosis not present

## 2015-06-17 DIAGNOSIS — Z79899 Other long term (current) drug therapy: Secondary | ICD-10-CM | POA: Diagnosis not present

## 2015-06-17 DIAGNOSIS — R9431 Abnormal electrocardiogram [ECG] [EKG]: Secondary | ICD-10-CM | POA: Diagnosis not present

## 2015-06-17 DIAGNOSIS — Z888 Allergy status to other drugs, medicaments and biological substances status: Secondary | ICD-10-CM | POA: Diagnosis not present

## 2015-06-17 DIAGNOSIS — F4310 Post-traumatic stress disorder, unspecified: Secondary | ICD-10-CM | POA: Diagnosis not present

## 2015-06-17 DIAGNOSIS — I10 Essential (primary) hypertension: Secondary | ICD-10-CM | POA: Diagnosis not present

## 2015-06-17 DIAGNOSIS — I4510 Unspecified right bundle-branch block: Secondary | ICD-10-CM | POA: Diagnosis not present

## 2015-06-17 DIAGNOSIS — I779 Disorder of arteries and arterioles, unspecified: Secondary | ICD-10-CM | POA: Diagnosis not present

## 2015-06-21 DIAGNOSIS — L4050 Arthropathic psoriasis, unspecified: Secondary | ICD-10-CM | POA: Diagnosis present

## 2015-06-21 DIAGNOSIS — F17200 Nicotine dependence, unspecified, uncomplicated: Secondary | ICD-10-CM | POA: Diagnosis present

## 2015-06-21 DIAGNOSIS — I10 Essential (primary) hypertension: Secondary | ICD-10-CM | POA: Diagnosis present

## 2015-06-21 DIAGNOSIS — I6523 Occlusion and stenosis of bilateral carotid arteries: Secondary | ICD-10-CM | POA: Diagnosis present

## 2015-06-21 DIAGNOSIS — F4310 Post-traumatic stress disorder, unspecified: Secondary | ICD-10-CM | POA: Diagnosis present

## 2015-06-21 DIAGNOSIS — I6521 Occlusion and stenosis of right carotid artery: Secondary | ICD-10-CM | POA: Diagnosis not present

## 2015-06-21 DIAGNOSIS — I6522 Occlusion and stenosis of left carotid artery: Secondary | ICD-10-CM | POA: Diagnosis not present

## 2015-06-21 DIAGNOSIS — G453 Amaurosis fugax: Secondary | ICD-10-CM | POA: Diagnosis present

## 2015-06-21 DIAGNOSIS — Z9889 Other specified postprocedural states: Secondary | ICD-10-CM | POA: Diagnosis not present

## 2015-07-01 DIAGNOSIS — I779 Disorder of arteries and arterioles, unspecified: Secondary | ICD-10-CM | POA: Diagnosis not present

## 2015-07-01 DIAGNOSIS — I10 Essential (primary) hypertension: Secondary | ICD-10-CM | POA: Diagnosis not present

## 2015-07-01 DIAGNOSIS — F331 Major depressive disorder, recurrent, moderate: Secondary | ICD-10-CM | POA: Diagnosis not present

## 2015-07-01 DIAGNOSIS — I739 Peripheral vascular disease, unspecified: Secondary | ICD-10-CM | POA: Diagnosis not present

## 2015-07-11 DIAGNOSIS — L4059 Other psoriatic arthropathy: Secondary | ICD-10-CM | POA: Diagnosis not present

## 2015-07-11 DIAGNOSIS — R21 Rash and other nonspecific skin eruption: Secondary | ICD-10-CM | POA: Diagnosis not present

## 2015-07-11 DIAGNOSIS — M1A09X0 Idiopathic chronic gout, multiple sites, without tophus (tophi): Secondary | ICD-10-CM | POA: Diagnosis not present

## 2015-07-11 DIAGNOSIS — L401 Generalized pustular psoriasis: Secondary | ICD-10-CM | POA: Diagnosis not present

## 2015-07-11 DIAGNOSIS — M5136 Other intervertebral disc degeneration, lumbar region: Secondary | ICD-10-CM | POA: Diagnosis not present

## 2015-07-11 DIAGNOSIS — M545 Low back pain: Secondary | ICD-10-CM | POA: Diagnosis not present

## 2015-09-11 DIAGNOSIS — M545 Low back pain: Secondary | ICD-10-CM | POA: Diagnosis not present

## 2015-09-11 DIAGNOSIS — L4059 Other psoriatic arthropathy: Secondary | ICD-10-CM | POA: Diagnosis not present

## 2015-09-11 DIAGNOSIS — M1A09X0 Idiopathic chronic gout, multiple sites, without tophus (tophi): Secondary | ICD-10-CM | POA: Diagnosis not present

## 2015-09-11 DIAGNOSIS — L401 Generalized pustular psoriasis: Secondary | ICD-10-CM | POA: Diagnosis not present

## 2015-09-11 DIAGNOSIS — M5136 Other intervertebral disc degeneration, lumbar region: Secondary | ICD-10-CM | POA: Diagnosis not present

## 2015-09-23 DIAGNOSIS — F17210 Nicotine dependence, cigarettes, uncomplicated: Secondary | ICD-10-CM | POA: Diagnosis not present

## 2015-09-23 DIAGNOSIS — I6523 Occlusion and stenosis of bilateral carotid arteries: Secondary | ICD-10-CM | POA: Diagnosis not present

## 2015-09-23 DIAGNOSIS — I6521 Occlusion and stenosis of right carotid artery: Secondary | ICD-10-CM | POA: Diagnosis not present

## 2015-09-23 DIAGNOSIS — Z9889 Other specified postprocedural states: Secondary | ICD-10-CM | POA: Diagnosis not present

## 2015-09-23 DIAGNOSIS — R011 Cardiac murmur, unspecified: Secondary | ICD-10-CM | POA: Diagnosis not present

## 2015-09-23 DIAGNOSIS — I2510 Atherosclerotic heart disease of native coronary artery without angina pectoris: Secondary | ICD-10-CM | POA: Diagnosis not present

## 2015-10-01 DIAGNOSIS — E782 Mixed hyperlipidemia: Secondary | ICD-10-CM | POA: Diagnosis not present

## 2015-10-03 DIAGNOSIS — I10 Essential (primary) hypertension: Secondary | ICD-10-CM | POA: Diagnosis not present

## 2015-10-03 DIAGNOSIS — F4310 Post-traumatic stress disorder, unspecified: Secondary | ICD-10-CM | POA: Diagnosis not present

## 2015-10-03 DIAGNOSIS — E782 Mixed hyperlipidemia: Secondary | ICD-10-CM | POA: Diagnosis not present

## 2015-10-03 DIAGNOSIS — I739 Peripheral vascular disease, unspecified: Secondary | ICD-10-CM | POA: Diagnosis not present

## 2015-11-05 DIAGNOSIS — M545 Low back pain: Secondary | ICD-10-CM | POA: Diagnosis not present

## 2015-11-05 DIAGNOSIS — M1A09X0 Idiopathic chronic gout, multiple sites, without tophus (tophi): Secondary | ICD-10-CM | POA: Diagnosis not present

## 2015-11-05 DIAGNOSIS — L4059 Other psoriatic arthropathy: Secondary | ICD-10-CM | POA: Diagnosis not present

## 2015-11-05 DIAGNOSIS — M5136 Other intervertebral disc degeneration, lumbar region: Secondary | ICD-10-CM | POA: Diagnosis not present

## 2015-11-05 DIAGNOSIS — L401 Generalized pustular psoriasis: Secondary | ICD-10-CM | POA: Diagnosis not present

## 2015-12-26 DIAGNOSIS — L401 Generalized pustular psoriasis: Secondary | ICD-10-CM | POA: Diagnosis not present

## 2015-12-26 DIAGNOSIS — L4059 Other psoriatic arthropathy: Secondary | ICD-10-CM | POA: Diagnosis not present

## 2015-12-26 DIAGNOSIS — M1A09X0 Idiopathic chronic gout, multiple sites, without tophus (tophi): Secondary | ICD-10-CM | POA: Diagnosis not present

## 2015-12-26 DIAGNOSIS — M545 Low back pain: Secondary | ICD-10-CM | POA: Diagnosis not present

## 2015-12-26 DIAGNOSIS — R109 Unspecified abdominal pain: Secondary | ICD-10-CM | POA: Diagnosis not present

## 2015-12-26 DIAGNOSIS — M5136 Other intervertebral disc degeneration, lumbar region: Secondary | ICD-10-CM | POA: Diagnosis not present

## 2016-03-12 DIAGNOSIS — M5136 Other intervertebral disc degeneration, lumbar region: Secondary | ICD-10-CM | POA: Diagnosis not present

## 2016-03-12 DIAGNOSIS — L4059 Other psoriatic arthropathy: Secondary | ICD-10-CM | POA: Diagnosis not present

## 2016-03-12 DIAGNOSIS — M1A09X0 Idiopathic chronic gout, multiple sites, without tophus (tophi): Secondary | ICD-10-CM | POA: Diagnosis not present

## 2016-03-12 DIAGNOSIS — L401 Generalized pustular psoriasis: Secondary | ICD-10-CM | POA: Diagnosis not present

## 2016-03-30 DIAGNOSIS — M5136 Other intervertebral disc degeneration, lumbar region: Secondary | ICD-10-CM | POA: Diagnosis not present

## 2016-04-01 DIAGNOSIS — M5136 Other intervertebral disc degeneration, lumbar region: Secondary | ICD-10-CM | POA: Diagnosis not present

## 2016-04-01 DIAGNOSIS — S22080A Wedge compression fracture of T11-T12 vertebra, initial encounter for closed fracture: Secondary | ICD-10-CM | POA: Diagnosis not present

## 2016-04-10 DIAGNOSIS — M5136 Other intervertebral disc degeneration, lumbar region: Secondary | ICD-10-CM | POA: Diagnosis not present

## 2016-04-15 DIAGNOSIS — Z23 Encounter for immunization: Secondary | ICD-10-CM | POA: Diagnosis not present

## 2016-04-15 DIAGNOSIS — I10 Essential (primary) hypertension: Secondary | ICD-10-CM | POA: Diagnosis not present

## 2016-04-15 DIAGNOSIS — L4050 Arthropathic psoriasis, unspecified: Secondary | ICD-10-CM | POA: Diagnosis not present

## 2016-04-15 DIAGNOSIS — T148 Other injury of unspecified body region: Secondary | ICD-10-CM | POA: Diagnosis not present

## 2016-04-15 DIAGNOSIS — E782 Mixed hyperlipidemia: Secondary | ICD-10-CM | POA: Diagnosis not present

## 2016-04-18 DIAGNOSIS — M5136 Other intervertebral disc degeneration, lumbar region: Secondary | ICD-10-CM | POA: Diagnosis not present

## 2016-04-18 DIAGNOSIS — S22080A Wedge compression fracture of T11-T12 vertebra, initial encounter for closed fracture: Secondary | ICD-10-CM | POA: Diagnosis not present

## 2016-04-28 ENCOUNTER — Other Ambulatory Visit (HOSPITAL_COMMUNITY): Payer: Self-pay | Admitting: Physical Medicine and Rehabilitation

## 2016-04-28 DIAGNOSIS — IMO0002 Reserved for concepts with insufficient information to code with codable children: Secondary | ICD-10-CM

## 2016-04-28 DIAGNOSIS — M545 Low back pain, unspecified: Secondary | ICD-10-CM

## 2016-04-30 ENCOUNTER — Other Ambulatory Visit (HOSPITAL_COMMUNITY): Payer: Self-pay | Admitting: Interventional Radiology

## 2016-04-30 ENCOUNTER — Telehealth (HOSPITAL_COMMUNITY): Payer: Self-pay

## 2016-04-30 DIAGNOSIS — IMO0002 Reserved for concepts with insufficient information to code with codable children: Secondary | ICD-10-CM

## 2016-04-30 NOTE — Telephone Encounter (Signed)
Called to schedule procedure, left message for pt to return call. AW

## 2016-05-06 DIAGNOSIS — M2550 Pain in unspecified joint: Secondary | ICD-10-CM | POA: Diagnosis not present

## 2016-05-06 DIAGNOSIS — M545 Low back pain: Secondary | ICD-10-CM | POA: Diagnosis not present

## 2016-05-14 DIAGNOSIS — M4854XA Collapsed vertebra, not elsewhere classified, thoracic region, initial encounter for fracture: Secondary | ICD-10-CM | POA: Diagnosis not present

## 2016-05-14 DIAGNOSIS — M8448XA Pathological fracture, other site, initial encounter for fracture: Secondary | ICD-10-CM | POA: Diagnosis not present

## 2016-06-11 DIAGNOSIS — M1A09X0 Idiopathic chronic gout, multiple sites, without tophus (tophi): Secondary | ICD-10-CM | POA: Diagnosis not present

## 2016-06-11 DIAGNOSIS — S22080A Wedge compression fracture of T11-T12 vertebra, initial encounter for closed fracture: Secondary | ICD-10-CM | POA: Diagnosis not present

## 2016-06-11 DIAGNOSIS — L4059 Other psoriatic arthropathy: Secondary | ICD-10-CM | POA: Diagnosis not present

## 2016-06-11 DIAGNOSIS — M5136 Other intervertebral disc degeneration, lumbar region: Secondary | ICD-10-CM | POA: Diagnosis not present

## 2016-06-11 DIAGNOSIS — L401 Generalized pustular psoriasis: Secondary | ICD-10-CM | POA: Diagnosis not present

## 2016-06-13 ENCOUNTER — Other Ambulatory Visit: Payer: Self-pay | Admitting: Internal Medicine

## 2016-06-13 DIAGNOSIS — S22080A Wedge compression fracture of T11-T12 vertebra, initial encounter for closed fracture: Secondary | ICD-10-CM

## 2016-07-08 DIAGNOSIS — Z139 Encounter for screening, unspecified: Secondary | ICD-10-CM | POA: Diagnosis not present

## 2016-07-08 DIAGNOSIS — Z23 Encounter for immunization: Secondary | ICD-10-CM | POA: Diagnosis not present

## 2016-07-08 DIAGNOSIS — Z125 Encounter for screening for malignant neoplasm of prostate: Secondary | ICD-10-CM | POA: Diagnosis not present

## 2016-07-08 DIAGNOSIS — Z79899 Other long term (current) drug therapy: Secondary | ICD-10-CM | POA: Diagnosis not present

## 2016-07-08 DIAGNOSIS — Z0000 Encounter for general adult medical examination without abnormal findings: Secondary | ICD-10-CM | POA: Diagnosis not present

## 2016-07-08 DIAGNOSIS — Z6830 Body mass index (BMI) 30.0-30.9, adult: Secondary | ICD-10-CM | POA: Diagnosis not present

## 2016-08-04 DIAGNOSIS — G8929 Other chronic pain: Secondary | ICD-10-CM | POA: Diagnosis not present

## 2016-08-04 DIAGNOSIS — M5136 Other intervertebral disc degeneration, lumbar region: Secondary | ICD-10-CM | POA: Diagnosis not present

## 2016-08-04 DIAGNOSIS — M25512 Pain in left shoulder: Secondary | ICD-10-CM | POA: Diagnosis not present

## 2016-08-04 DIAGNOSIS — M25551 Pain in right hip: Secondary | ICD-10-CM | POA: Diagnosis not present

## 2016-09-17 DIAGNOSIS — I6523 Occlusion and stenosis of bilateral carotid arteries: Secondary | ICD-10-CM | POA: Diagnosis not present

## 2016-09-17 DIAGNOSIS — Z9889 Other specified postprocedural states: Secondary | ICD-10-CM | POA: Diagnosis not present

## 2016-10-06 DIAGNOSIS — I10 Essential (primary) hypertension: Secondary | ICD-10-CM | POA: Diagnosis not present

## 2016-10-06 DIAGNOSIS — M109 Gout, unspecified: Secondary | ICD-10-CM | POA: Diagnosis not present

## 2016-10-06 DIAGNOSIS — E782 Mixed hyperlipidemia: Secondary | ICD-10-CM | POA: Diagnosis not present

## 2016-10-09 DIAGNOSIS — I739 Peripheral vascular disease, unspecified: Secondary | ICD-10-CM | POA: Diagnosis not present

## 2016-10-09 DIAGNOSIS — M109 Gout, unspecified: Secondary | ICD-10-CM | POA: Diagnosis not present

## 2016-10-09 DIAGNOSIS — L4050 Arthropathic psoriasis, unspecified: Secondary | ICD-10-CM | POA: Diagnosis not present

## 2016-10-09 DIAGNOSIS — I10 Essential (primary) hypertension: Secondary | ICD-10-CM | POA: Diagnosis not present

## 2016-10-19 DIAGNOSIS — M1A09X0 Idiopathic chronic gout, multiple sites, without tophus (tophi): Secondary | ICD-10-CM | POA: Diagnosis not present

## 2016-10-19 DIAGNOSIS — M5136 Other intervertebral disc degeneration, lumbar region: Secondary | ICD-10-CM | POA: Diagnosis not present

## 2016-10-19 DIAGNOSIS — L401 Generalized pustular psoriasis: Secondary | ICD-10-CM | POA: Diagnosis not present

## 2016-10-19 DIAGNOSIS — E663 Overweight: Secondary | ICD-10-CM | POA: Diagnosis not present

## 2016-10-19 DIAGNOSIS — Z6828 Body mass index (BMI) 28.0-28.9, adult: Secondary | ICD-10-CM | POA: Diagnosis not present

## 2016-10-19 DIAGNOSIS — S22080A Wedge compression fracture of T11-T12 vertebra, initial encounter for closed fracture: Secondary | ICD-10-CM | POA: Diagnosis not present

## 2016-10-19 DIAGNOSIS — L4059 Other psoriatic arthropathy: Secondary | ICD-10-CM | POA: Diagnosis not present

## 2016-10-27 DIAGNOSIS — L4059 Other psoriatic arthropathy: Secondary | ICD-10-CM | POA: Diagnosis not present

## 2016-11-24 DIAGNOSIS — Z79899 Other long term (current) drug therapy: Secondary | ICD-10-CM | POA: Diagnosis not present

## 2016-11-24 DIAGNOSIS — L4059 Other psoriatic arthropathy: Secondary | ICD-10-CM | POA: Diagnosis not present

## 2017-01-19 DIAGNOSIS — L4059 Other psoriatic arthropathy: Secondary | ICD-10-CM | POA: Diagnosis not present

## 2017-01-25 DIAGNOSIS — L401 Generalized pustular psoriasis: Secondary | ICD-10-CM | POA: Diagnosis not present

## 2017-01-25 DIAGNOSIS — L4059 Other psoriatic arthropathy: Secondary | ICD-10-CM | POA: Diagnosis not present

## 2017-01-25 DIAGNOSIS — M5136 Other intervertebral disc degeneration, lumbar region: Secondary | ICD-10-CM | POA: Diagnosis not present

## 2017-01-25 DIAGNOSIS — Z6827 Body mass index (BMI) 27.0-27.9, adult: Secondary | ICD-10-CM | POA: Diagnosis not present

## 2017-01-25 DIAGNOSIS — S22080A Wedge compression fracture of T11-T12 vertebra, initial encounter for closed fracture: Secondary | ICD-10-CM | POA: Diagnosis not present

## 2017-01-25 DIAGNOSIS — M1A09X0 Idiopathic chronic gout, multiple sites, without tophus (tophi): Secondary | ICD-10-CM | POA: Diagnosis not present

## 2017-01-25 DIAGNOSIS — E663 Overweight: Secondary | ICD-10-CM | POA: Diagnosis not present

## 2017-03-22 DIAGNOSIS — R21 Rash and other nonspecific skin eruption: Secondary | ICD-10-CM | POA: Diagnosis not present

## 2017-03-22 DIAGNOSIS — L4059 Other psoriatic arthropathy: Secondary | ICD-10-CM | POA: Diagnosis not present

## 2017-03-22 DIAGNOSIS — M1A09X0 Idiopathic chronic gout, multiple sites, without tophus (tophi): Secondary | ICD-10-CM | POA: Diagnosis not present

## 2017-03-30 DIAGNOSIS — H2513 Age-related nuclear cataract, bilateral: Secondary | ICD-10-CM | POA: Diagnosis not present

## 2017-03-30 DIAGNOSIS — H524 Presbyopia: Secondary | ICD-10-CM | POA: Diagnosis not present

## 2017-04-14 DIAGNOSIS — M109 Gout, unspecified: Secondary | ICD-10-CM | POA: Diagnosis not present

## 2017-04-14 DIAGNOSIS — I10 Essential (primary) hypertension: Secondary | ICD-10-CM | POA: Diagnosis not present

## 2017-04-14 DIAGNOSIS — E782 Mixed hyperlipidemia: Secondary | ICD-10-CM | POA: Diagnosis not present

## 2017-04-16 DIAGNOSIS — E782 Mixed hyperlipidemia: Secondary | ICD-10-CM | POA: Diagnosis not present

## 2017-04-16 DIAGNOSIS — Z6829 Body mass index (BMI) 29.0-29.9, adult: Secondary | ICD-10-CM | POA: Diagnosis not present

## 2017-04-16 DIAGNOSIS — M109 Gout, unspecified: Secondary | ICD-10-CM | POA: Diagnosis not present

## 2017-04-16 DIAGNOSIS — I10 Essential (primary) hypertension: Secondary | ICD-10-CM | POA: Diagnosis not present

## 2017-04-28 DIAGNOSIS — S22080A Wedge compression fracture of T11-T12 vertebra, initial encounter for closed fracture: Secondary | ICD-10-CM | POA: Diagnosis not present

## 2017-04-28 DIAGNOSIS — L401 Generalized pustular psoriasis: Secondary | ICD-10-CM | POA: Diagnosis not present

## 2017-04-28 DIAGNOSIS — M1A09X0 Idiopathic chronic gout, multiple sites, without tophus (tophi): Secondary | ICD-10-CM | POA: Diagnosis not present

## 2017-04-28 DIAGNOSIS — Z6829 Body mass index (BMI) 29.0-29.9, adult: Secondary | ICD-10-CM | POA: Diagnosis not present

## 2017-04-28 DIAGNOSIS — M5136 Other intervertebral disc degeneration, lumbar region: Secondary | ICD-10-CM | POA: Diagnosis not present

## 2017-04-28 DIAGNOSIS — L4059 Other psoriatic arthropathy: Secondary | ICD-10-CM | POA: Diagnosis not present

## 2017-05-14 DIAGNOSIS — L4050 Arthropathic psoriasis, unspecified: Secondary | ICD-10-CM | POA: Diagnosis not present

## 2017-05-14 DIAGNOSIS — I10 Essential (primary) hypertension: Secondary | ICD-10-CM | POA: Diagnosis not present

## 2017-05-14 DIAGNOSIS — R739 Hyperglycemia, unspecified: Secondary | ICD-10-CM | POA: Diagnosis not present

## 2017-05-14 DIAGNOSIS — Z23 Encounter for immunization: Secondary | ICD-10-CM | POA: Diagnosis not present

## 2017-05-14 DIAGNOSIS — E782 Mixed hyperlipidemia: Secondary | ICD-10-CM | POA: Diagnosis not present

## 2017-05-17 DIAGNOSIS — L4059 Other psoriatic arthropathy: Secondary | ICD-10-CM | POA: Diagnosis not present

## 2017-06-18 DIAGNOSIS — M109 Gout, unspecified: Secondary | ICD-10-CM | POA: Diagnosis not present

## 2017-06-18 DIAGNOSIS — I739 Peripheral vascular disease, unspecified: Secondary | ICD-10-CM | POA: Diagnosis not present

## 2017-06-18 DIAGNOSIS — M79673 Pain in unspecified foot: Secondary | ICD-10-CM | POA: Diagnosis not present

## 2017-06-18 DIAGNOSIS — Z6829 Body mass index (BMI) 29.0-29.9, adult: Secondary | ICD-10-CM | POA: Diagnosis not present

## 2017-06-29 DIAGNOSIS — I70203 Unspecified atherosclerosis of native arteries of extremities, bilateral legs: Secondary | ICD-10-CM | POA: Diagnosis not present

## 2017-06-29 DIAGNOSIS — R600 Localized edema: Secondary | ICD-10-CM | POA: Diagnosis not present

## 2017-06-29 DIAGNOSIS — R609 Edema, unspecified: Secondary | ICD-10-CM | POA: Diagnosis not present

## 2017-07-06 ENCOUNTER — Other Ambulatory Visit: Payer: Self-pay

## 2017-07-06 DIAGNOSIS — I739 Peripheral vascular disease, unspecified: Secondary | ICD-10-CM | POA: Diagnosis not present

## 2017-07-06 DIAGNOSIS — R6882 Decreased libido: Secondary | ICD-10-CM | POA: Diagnosis not present

## 2017-07-06 DIAGNOSIS — F331 Major depressive disorder, recurrent, moderate: Secondary | ICD-10-CM | POA: Diagnosis not present

## 2017-07-06 DIAGNOSIS — M109 Gout, unspecified: Secondary | ICD-10-CM | POA: Diagnosis not present

## 2017-07-12 DIAGNOSIS — L4059 Other psoriatic arthropathy: Secondary | ICD-10-CM | POA: Diagnosis not present

## 2017-07-28 DIAGNOSIS — Z6829 Body mass index (BMI) 29.0-29.9, adult: Secondary | ICD-10-CM | POA: Diagnosis not present

## 2017-07-28 DIAGNOSIS — S22080A Wedge compression fracture of T11-T12 vertebra, initial encounter for closed fracture: Secondary | ICD-10-CM | POA: Diagnosis not present

## 2017-07-28 DIAGNOSIS — L4059 Other psoriatic arthropathy: Secondary | ICD-10-CM | POA: Diagnosis not present

## 2017-07-28 DIAGNOSIS — M5136 Other intervertebral disc degeneration, lumbar region: Secondary | ICD-10-CM | POA: Diagnosis not present

## 2017-07-28 DIAGNOSIS — R21 Rash and other nonspecific skin eruption: Secondary | ICD-10-CM | POA: Diagnosis not present

## 2017-07-28 DIAGNOSIS — M1A09X0 Idiopathic chronic gout, multiple sites, without tophus (tophi): Secondary | ICD-10-CM | POA: Diagnosis not present

## 2017-07-28 DIAGNOSIS — L401 Generalized pustular psoriasis: Secondary | ICD-10-CM | POA: Diagnosis not present

## 2017-08-04 ENCOUNTER — Encounter: Payer: Self-pay | Admitting: Vascular Surgery

## 2017-08-04 ENCOUNTER — Encounter (HOSPITAL_COMMUNITY): Payer: Medicare Other

## 2017-08-04 ENCOUNTER — Ambulatory Visit (HOSPITAL_COMMUNITY)
Admission: RE | Admit: 2017-08-04 | Discharge: 2017-08-04 | Disposition: A | Discharge status: Home / Self Care | Payer: Medicare Other | Source: Ambulatory Visit | Attending: Vascular Surgery | Admitting: Vascular Surgery

## 2017-08-04 ENCOUNTER — Ambulatory Visit (INDEPENDENT_AMBULATORY_CARE_PROVIDER_SITE_OTHER): Payer: Medicare Other | Admitting: Vascular Surgery

## 2017-08-04 ENCOUNTER — Encounter: Payer: Medicare Other | Admitting: Vascular Surgery

## 2017-08-04 VITALS — BP 144/81 | HR 64 | Temp 97.1°F | Resp 20 | Ht 70.0 in | Wt 214.6 lb

## 2017-08-04 DIAGNOSIS — I739 Peripheral vascular disease, unspecified: Secondary | ICD-10-CM | POA: Diagnosis not present

## 2017-08-04 NOTE — Progress Notes (Signed)
Patient name: Eric Hoffman MRN: [id number] DOB: [birth year] Sex: male   REASON FOR CONSULT:    Right foot pain.  The consult is requested by Dr. Rachell Cipro.  HPI:   Eric Hoffman is a pleasant 76 y.o. male, who presents for evaluation of peripheral vascular disease.  Patient had some right leg swelling which prompted a venous duplex scan.  This showed no evidence of DVT but an incidental finding was some atherosclerosis in the femoral arteries.  This reason he was sent for vascular consultation.  Patient is a difficult historian in that he has multiple symptoms and issues.  He does have a history of psoriatic arthritis of the right leg and undergoes injection therapy for this.  He also had some problems with dizziness and fell and injured his back.  On my history I really do not get any clear history of claudication in either lower extremity.  I do not get any history of rest pain or history of nonhealing ulcers.  He has had an area of irritation over his anterior right leg distally which is been present for 4 years according to his wife.  He also had a tick bite in this area.  He is unaware of any history of DVT.  Of note the patient has undergone a previous left carotid endarterectomy by Dr. Vicente Masson.  This was done for asymptomatic left carotid stenosis.  He was having amaurosis fugax.  I have reviewed the records that were sent from Dr. Ival Bible office.  Patient has a history of benign essential hypertension and hyperlipidemia.  These have been under good control.  The patient had an incidental finding on a venous duplex scan which showed some atherosclerosis in the femoral arteries bilaterally.  The patient is sent for vascular consultation.   Past Medical History:  Diagnosis Date  . Allergic rhinitis   . Anxiety   . Dermatitis   . Heart murmur   . Hyperlipidemia   . Hypertension   . Leg pain, bilateral   . Malaria 1970 to 1982  . UTI (lower urinary tract infection)       Family History  Problem Relation Age of Onset  . Cancer Mother        breast  . Cancer Brother        brain   . Cancer Brother        renal cancer    SOCIAL HISTORY: Social History   Socioeconomic History  . Marital status: Married    Spouse name: Not on file  . Number of children: Not on file  . Years of education: Not on file  . Highest education level: Not on file  Social Needs  . Financial resource strain: Not on file  . Food insecurity - worry: Not on file  . Food insecurity - inability: Not on file  . Transportation needs - medical: Not on file  . Transportation needs - non-medical: Not on file  Occupational History  . Not on file  Tobacco Use  . Smoking status: Former Research scientist (life sciences)  . Smokeless tobacco: Never Used  Substance and Sexual Activity  . Alcohol use: Yes    Alcohol/week: 14.4 oz    Types: 10 Glasses of wine, 14 Shots of liquor per week  . Drug use: No  . Sexual activity: Not Currently    Partners: Female, Male  Other Topics Concern  . Not on file  Social History Narrative  . Not on file    Allergies  Allergen Reactions  . Benadryl [Diphenhydramine Hcl]     Hyperactivity,  Difficulty breathing, difficulty swallowing.  Pt denies allergy.  . Effexor [Venlafaxine Hydrochloride] Swelling    Of throat  . Lariam [Mefloquine]     Suicidal ideation    Current Outpatient Medications  Medication Sig Dispense Refill  . ALPRAZolam (XANAX) 1 MG tablet TAKE 1 TABLET BY MOUTH THREE TIMES DAILY AS NEEDED 90 tablet 2  . aspirin 325 MG tablet Take 325 mg by mouth daily.    . cetirizine (ZYRTEC) 10 MG tablet Take 10 mg by mouth daily as needed. For allergies    . chlorthalidone (HYGROTON) 25 MG tablet TAKE 1 TABLET (25 MG TOTAL) BY MOUTH DAILY. 90 tablet 3  . fluticasone (FLONASE) 50 MCG/ACT nasal spray PLACE 2 SPRAYS INTO THE NOSE DAILY. 16 g 5  . meloxicam (MOBIC) 15 MG tablet TAKE 1 TABLET (15 MG TOTAL) BY MOUTH DAILY. 30 tablet 1  . OVER THE COUNTER  MEDICATION Take 3 tablets by mouth daily. Instaflex    . Potassium Chloride ER 20 MEQ TBCR TAKE 1 TABLET (20 MEQ TOTAL) BY MOUTH DAILY. 30 tablet 5  . predniSONE (DELTASONE) 20 MG tablet Take 1 tablet (20 mg total) by mouth 2 (two) times daily with a meal. 14 tablet 0  . sertraline (ZOLOFT) 100 MG tablet Take 100 mg by mouth daily. 1/2 tablet per day    . sildenafil (VIAGRA) 100 MG tablet Take 100 mg by mouth daily as needed. For Erectile Dysfunction    . simvastatin (ZOCOR) 20 MG tablet Take 1 tablet (20 mg total) by mouth at bedtime. 90 tablet 3  . temazepam (RESTORIL) 15 MG capsule TAKE ONE OR TWO CAPSULES BY MOUTH AT BEDTIME AS NEEDED FOR SLEEP 60 capsule 2  . VIAGRA 100 MG tablet TAKE 1 TABLET (100 MG TOTAL) BY MOUTH AS DIRECTED. 4 tablet 4   No current facility-administered medications for this visit.     REVIEW OF SYSTEMS:  [X]  denotes positive finding, [ ]  denotes negative finding Cardiac  Comments:  Chest pain or chest pressure:    Shortness of breath upon exertion:    Short of breath when lying flat:    Irregular heart rhythm:        Vascular    Pain in calf, thigh, or hip brought on by ambulation:    Pain in feet at night that wakes you up from your sleep:     Blood clot in your veins:    Leg swelling:  X  right leg      Pulmonary    Oxygen at home:    Productive cough:     Wheezing:         Neurologic    Sudden weakness in arms or legs:     Sudden numbness in arms or legs:     Sudden onset of difficulty speaking or slurred speech:    Temporary loss of vision in one eye:     Problems with dizziness:         Gastrointestinal    Blood in stool:     Vomited blood:         Genitourinary    Burning when urinating:     Blood in urine:        Psychiatric    Major depression:         Hematologic    Bleeding problems:    Problems with blood clotting too easily:  Skin    Rashes or ulcers:        Constitutional    Fever or chills:     PHYSICAL EXAM:    Vitals:   08/04/17 1103  BP: (!) 144/81  Pulse: 64  Resp: 20  Temp: (!) 97.1 F (36.2 C)  SpO2: 96%  Weight: 214 lb 9.6 oz (97.3 kg)  Height: 5\' 10"  (1.778 m)    GENERAL: The patient is a well-nourished male, in no acute distress. The vital signs are documented above. CARDIAC: There is a regular rate and rhythm.  VASCULAR: I do not detect carotid bruits. On the right side he has a palpable femoral and popliteal pulse.  I cannot palpate pedal pulses. On the left side he has a palpable femoral pulse.  I cannot palpate popliteal or pedal pulses. He has some chronic right lower extremity swelling. He does have some hyperpigmentation bilaterally consistent with chronic venous insufficiency. PULMONARY: There is good air exchange bilaterally without wheezing or rales. ABDOMEN: Soft and non-tender with normal pitched bowel sounds.  MUSCULOSKELETAL: There are no major deformities or cyanosis. NEUROLOGIC: No focal weakness or paresthesias are detected. SKIN: There are no ulcers or rashes noted. PSYCHIATRIC: The patient has a normal affect.  DATA:    VENOUS DUPLEX: I reviewed the venous duplex scan that was done on 06/29/2017.  This showed no evidence of DVT of the right lower extremity.  LABS: GFR is 94.  This was from 04/14/2017  ARTERIAL DOPPLER STUDY: I have independently interpreted his arterial Doppler study today.    On the right side there are monophasic Doppler signals in the dorsalis pedis and posterior tibial positions.  ABI 68%.  Toe pressure on the right is 77 mm.  On the left side there is a monophasic dorsalis pedis signal.  The posterior tibial signal cannot be obtained.  ABI on the left is 65%.  Toe pressure on the left is 81 mmHg.  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  However I cannot attribute his symptoms in the  right ankle to his peripheral vascular disease.  His ABIs are at 68% on the right 65% of the left which is reasonable.  He has no rest pain or nonhealing ulcers.  I think he also has some evidence of chronic venous insufficiency bilaterally given his leg swelling and hyperpigmentation.  We discussed the importance of leg elevation although I have explained that I would elevate my legs only on a couple of pillows given that he has peripheral vascular disease.  I think if he elevates his legs much higher than that he might experience some foot pain.  I will be happy to see him back at any time for any new vascular issues arise.  I believe he is also scheduled to see his dermatologist concerning the dry skin and a rash on the right leg which has been chronic.  Deitra Mayo [facility] [phone]

## 2017-08-08 ENCOUNTER — Encounter (HOSPITAL_COMMUNITY): Payer: Self-pay | Admitting: Emergency Medicine

## 2017-08-08 ENCOUNTER — Emergency Department (HOSPITAL_COMMUNITY)
Admission: EM | Admit: 2017-08-08 | Discharge: 2017-08-08 | Disposition: A | Discharge status: Home / Self Care | Payer: Medicare Other | Attending: Emergency Medicine | Admitting: Emergency Medicine

## 2017-08-08 DIAGNOSIS — L03115 Cellulitis of right lower limb: Secondary | ICD-10-CM | POA: Diagnosis not present

## 2017-08-08 DIAGNOSIS — Z87891 Personal history of nicotine dependence: Secondary | ICD-10-CM | POA: Insufficient documentation

## 2017-08-08 DIAGNOSIS — Z79899 Other long term (current) drug therapy: Secondary | ICD-10-CM | POA: Insufficient documentation

## 2017-08-08 DIAGNOSIS — L309 Dermatitis, unspecified: Secondary | ICD-10-CM | POA: Insufficient documentation

## 2017-08-08 DIAGNOSIS — Z7982 Long term (current) use of aspirin: Secondary | ICD-10-CM | POA: Diagnosis not present

## 2017-08-08 DIAGNOSIS — I10 Essential (primary) hypertension: Secondary | ICD-10-CM | POA: Insufficient documentation

## 2017-08-08 DIAGNOSIS — L4059 Other psoriatic arthropathy: Secondary | ICD-10-CM | POA: Diagnosis present

## 2017-08-08 MED ORDER — DOXYCYCLINE HYCLATE 100 MG PO CAPS: 100.0000 mg | ORAL_CAPSULE | Freq: Two times a day (BID) | ORAL | 0 refills | Status: AC

## 2017-08-08 NOTE — ED Triage Notes (Addendum)
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 He has been to multiple MD apts in the past 2 months for this, has already had a venous duplex, a vein study, and an xray. Afebrile.   Pt thinks " I think I have the antibodies of untreated lyme disease based on the rare journals and studies I have read."

## 2017-08-08 NOTE — Discharge Instructions (Signed)
1. Make an appointment with a dermatologist as soon as possible.

## 2017-08-08 NOTE — ED Provider Notes (Addendum)
[place] EMERGENCY DEPARTMENT Provider Note   CSN: [id number] Arrival date & time: 08/08/17  1610     History   Chief Complaint Chief Complaint  Patient presents with  . psoriatic arthritis  . Gout    HPI Eric Hoffman is a 76 y.o. male.  HPI Patient has had chronic problems with a scaling, nonhealing plaque over his right anterior ankle for about 4 years.  Symptoms have recently been worsening with increasing swelling, redness and pain with walking.  Initially patient had a diagnosis of psoriatic arthritis as well as gout history.  He reports at one point time he was treated with methotrexate.  He does continue to see rheumatology.  Patient was seen by vascular specialist 4 days ago.  DVT studies were negative.  ABIs did not show any critical arterial stenosis. Past Medical History:  Diagnosis Date  . Allergic rhinitis   . Anxiety   . Dermatitis   . Heart murmur   . Hyperlipidemia   . Hypertension   . Leg pain, bilateral   . Malaria 1970 to 1982  . UTI (lower urinary tract infection)     Patient Active Problem List   Diagnosis Date Noted  . Peripheral vascular disease, unspecified ([place]) 09/20/2012  . Claudication in peripheral vascular disease ([place]) 08/25/2012  . Dysuria 03/11/2012  . PTSD (post-traumatic stress disorder) 10/30/2011  . Alcohol abuse, daily use 10/30/2011  . Tobacco abuse 10/30/2011  . Insomnia 10/30/2011  . ANXIETY 07/07/2010  . HYPERLIPIDEMIA 05/15/2008  . HYPERTENSION 05/15/2008  . ALLERGIC RHINITIS 05/15/2008    Past Surgical History:  Procedure Laterality Date  . COLONOSCOPY  12/2009  . TONSILLECTOMY  1950       Home Medications    Prior to Admission medications   Medication Sig Start Date End Date Taking? Authorizing Provider  ALPRAZolam Duanne Moron) 1 MG tablet TAKE 1 TABLET BY MOUTH THREE TIMES DAILY AS NEEDED 04/12/14   Marletta Lor, MD  aspirin 325 MG tablet Take 325 mg by mouth daily.    [provider]  cetirizine (ZYRTEC) 10 MG tablet Take 10 mg by mouth daily as needed. For allergies    [provider]  chlorthalidone (HYGROTON) 25 MG tablet TAKE 1 TABLET (25 MG TOTAL) BY MOUTH DAILY.    Marletta Lor, MD  doxycycline (VIBRAMYCIN) 100 MG capsule Take 1 capsule (100 mg total) by mouth 2 (two) times daily. One po bid x 7 days 08/08/17   Charlesetta Shanks, MD  fluticasone (FLONASE) 50 MCG/ACT nasal spray PLACE 2 SPRAYS INTO THE NOSE DAILY. 07/02/14   Marletta Lor, MD  meloxicam (MOBIC) 15 MG tablet TAKE 1 TABLET (15 MG TOTAL) BY MOUTH DAILY. 12/11/14   Marletta Lor, MD  OVER THE COUNTER MEDICATION Take 3 tablets by mouth daily. Instaflex    [provider]  Potassium Chloride ER 20 MEQ TBCR TAKE 1 TABLET (20 MEQ TOTAL) BY MOUTH DAILY.    Marletta Lor, MD  predniSONE (DELTASONE) 20 MG tablet Take 1 tablet (20 mg total) by mouth 2 (two) times daily with a meal. 12/05/13   Marletta Lor, MD  sertraline (ZOLOFT) 100 MG tablet Take 100 mg by mouth daily. 1/2 tablet per day    [provider]  sildenafil (VIAGRA) 100 MG tablet Take 100 mg by mouth daily as needed. For Erectile Dysfunction    [provider]  simvastatin (ZOCOR) 20 MG tablet Take 1 tablet (20 mg total)  by mouth at bedtime. 12/05/13   Marletta Lor, MD  temazepam (RESTORIL) 15 MG capsule TAKE ONE OR TWO CAPSULES BY MOUTH AT BEDTIME AS NEEDED FOR SLEEP 04/02/14   Marletta Lor, MD  VIAGRA 100 MG tablet TAKE 1 TABLET (100 MG TOTAL) BY MOUTH AS DIRECTED. 04/23/14   Marletta Lor, MD    Family History Family History  Problem Relation Age of Onset  . Cancer Mother        breast  . Cancer Brother        brain   . Cancer Brother        renal cancer    Social History Social History   Tobacco Use  . Smoking status: Former Research scientist (life sciences)  . Smokeless tobacco: Never Used  Substance Use Topics  . Alcohol use: Yes    Alcohol/week: 14.4 oz     Types: 10 Glasses of wine, 14 Shots of liquor per week  . Drug use: No     Allergies   Benadryl [diphenhydramine hcl]; Effexor [venlafaxine hydrochloride]; and Lariam [mefloquine]   Review of Systems Review of Systems 10 Systems reviewed and are negative for acute change except as noted in the HPI.  Physical Exam Updated Vital Signs BP (!) 148/81   Pulse 88   Temp 97.8 F (36.6 C)   Resp 18   SpO2 98%   Physical Exam  Constitutional: He is oriented to person, place, and time. He appears well-developed and well-nourished. No distress.  HENT:  Head: Normocephalic and atraumatic.  Patient does have angular cheilitis.  Mucous membranes are otherwise moist without evident lesions.  Eyes: EOM are normal.  Cardiovascular: Normal rate and regular rhythm.  Murmur heard. Pulmonary/Chest: Effort normal and breath sounds normal.  Musculoskeletal: Normal range of motion. He exhibits edema and tenderness.  Patient has 2+ pitting edema of the foot and the lower leg on the right.  He has a dry patch of thick scaling skin over most of the anterior ankle and lower leg area.  Mild diffuse blanching erythema.  See attached images.  Neurological: He is alert and oriented to person, place, and time. He exhibits normal muscle tone. Coordination normal.  Patient ambulatory without difficulty,  Skin: Skin is warm and dry. Rash noted.  Psychiatric: He has a normal mood and affect.             ED Treatments / Results  Labs (all labs ordered are listed, but only abnormal results are displayed) Labs Reviewed - No data to display  EKG  EKG Interpretation None       Radiology No results found.  Procedures Procedures (including critical care time)  Medications Ordered in ED Medications - No data to display   Initial Impression / Assessment and Plan / ED Course  I have reviewed the triage vital signs and the nursing notes.  Pertinent labs & imaging results that were available  during my care of the patient were reviewed by me and considered in my medical decision making (see chart for details).      Final Clinical Impressions(s) / ED Diagnoses   Final diagnoses:  Cellulitis of right lower extremity  Chronic dermatitis   Patient has a very chronic element to his lower leg dermatitis.  He however has been getting increasing swelling redness and scaling he reports.  Objectively on exam patient does have 2+ pitting edema with diffuse erythema.  He does not have significant tenderness.  Patient is ambulatory without difficulty.  He  has had diagnostic workup within the past week including ultrasound to rule out DVT and arterial evaluation.  At this point the recommendations are for follow-up with dermatology for the chronic, thick scaling lesion.  Patient will be empirically placed on doxycycline for secondary cellulitis.  Patient has prior background in chemistry and medicine has also done extensive reading and has some hypotheses regarding Lyme disease.  We did discuss that I think this is of low probability both given the symptoms and history as well as the fact that the patient reports to me he had Lyme testing which was negative.  I do however feel in light of the erythema and swelling and suspicion for secondary infection that doxycycline is reasonable for empiric  treatment of cellulitis. ED Discharge Orders        Ordered    doxycycline (VIBRAMYCIN) 100 MG capsule  2 times daily     08/08/17 Sharen Heck, MD 08/08/17 Pecola Leisure, MD 08/08/17 [phone]

## 2017-08-09 DIAGNOSIS — E782 Mixed hyperlipidemia: Secondary | ICD-10-CM | POA: Diagnosis not present

## 2017-08-09 DIAGNOSIS — M109 Gout, unspecified: Secondary | ICD-10-CM | POA: Diagnosis not present

## 2017-08-11 DIAGNOSIS — I739 Peripheral vascular disease, unspecified: Secondary | ICD-10-CM | POA: Diagnosis not present

## 2017-08-11 DIAGNOSIS — E782 Mixed hyperlipidemia: Secondary | ICD-10-CM | POA: Diagnosis not present

## 2017-08-11 DIAGNOSIS — I10 Essential (primary) hypertension: Secondary | ICD-10-CM | POA: Diagnosis not present

## 2017-08-11 DIAGNOSIS — F331 Major depressive disorder, recurrent, moderate: Secondary | ICD-10-CM | POA: Diagnosis not present

## 2017-09-06 DIAGNOSIS — L4059 Other psoriatic arthropathy: Secondary | ICD-10-CM | POA: Diagnosis not present

## 2017-09-06 DIAGNOSIS — Z79899 Other long term (current) drug therapy: Secondary | ICD-10-CM | POA: Diagnosis not present

## 2017-09-23 DIAGNOSIS — L4050 Arthropathic psoriasis, unspecified: Secondary | ICD-10-CM | POA: Diagnosis not present

## 2017-09-23 DIAGNOSIS — Z9181 History of falling: Secondary | ICD-10-CM | POA: Diagnosis not present

## 2017-09-23 DIAGNOSIS — F411 Generalized anxiety disorder: Secondary | ICD-10-CM | POA: Diagnosis not present

## 2017-09-23 DIAGNOSIS — E782 Mixed hyperlipidemia: Secondary | ICD-10-CM | POA: Diagnosis not present

## 2017-09-23 DIAGNOSIS — I10 Essential (primary) hypertension: Secondary | ICD-10-CM | POA: Diagnosis not present

## 2017-09-23 DIAGNOSIS — I739 Peripheral vascular disease, unspecified: Secondary | ICD-10-CM | POA: Diagnosis not present

## 2017-09-30 DIAGNOSIS — I6523 Occlusion and stenosis of bilateral carotid arteries: Secondary | ICD-10-CM | POA: Diagnosis not present

## 2017-10-13 DIAGNOSIS — L309 Dermatitis, unspecified: Secondary | ICD-10-CM | POA: Diagnosis not present

## 2017-10-13 DIAGNOSIS — L218 Other seborrheic dermatitis: Secondary | ICD-10-CM | POA: Diagnosis not present

## 2017-11-10 DIAGNOSIS — L309 Dermatitis, unspecified: Secondary | ICD-10-CM | POA: Diagnosis not present

## 2017-11-29 DIAGNOSIS — R21 Rash and other nonspecific skin eruption: Secondary | ICD-10-CM | POA: Diagnosis not present

## 2017-11-29 DIAGNOSIS — L401 Generalized pustular psoriasis: Secondary | ICD-10-CM | POA: Diagnosis not present

## 2017-11-29 DIAGNOSIS — E663 Overweight: Secondary | ICD-10-CM | POA: Diagnosis not present

## 2017-11-29 DIAGNOSIS — L4059 Other psoriatic arthropathy: Secondary | ICD-10-CM | POA: Diagnosis not present

## 2017-11-29 DIAGNOSIS — S22080A Wedge compression fracture of T11-T12 vertebra, initial encounter for closed fracture: Secondary | ICD-10-CM | POA: Diagnosis not present

## 2017-11-29 DIAGNOSIS — M1A09X0 Idiopathic chronic gout, multiple sites, without tophus (tophi): Secondary | ICD-10-CM | POA: Diagnosis not present

## 2017-11-29 DIAGNOSIS — M5136 Other intervertebral disc degeneration, lumbar region: Secondary | ICD-10-CM | POA: Diagnosis not present

## 2017-11-29 DIAGNOSIS — Z6829 Body mass index (BMI) 29.0-29.9, adult: Secondary | ICD-10-CM | POA: Diagnosis not present

## 2017-12-28 ENCOUNTER — Emergency Department (HOSPITAL_COMMUNITY): Payer: Medicare Other

## 2017-12-28 ENCOUNTER — Emergency Department (HOSPITAL_COMMUNITY)
Admission: EM | Admit: 2017-12-28 | Discharge: 2017-12-28 | Disposition: A | Discharge status: Home / Self Care | Payer: Medicare Other | Attending: Emergency Medicine | Admitting: Emergency Medicine

## 2017-12-28 ENCOUNTER — Encounter (HOSPITAL_COMMUNITY): Payer: Self-pay

## 2017-12-28 DIAGNOSIS — Y92007 Garden or yard of unspecified non-institutional (private) residence as the place of occurrence of the external cause: Secondary | ICD-10-CM | POA: Diagnosis not present

## 2017-12-28 DIAGNOSIS — W19XXXA Unspecified fall, initial encounter: Secondary | ICD-10-CM

## 2017-12-28 DIAGNOSIS — S90412A Abrasion, left great toe, initial encounter: Secondary | ICD-10-CM | POA: Diagnosis not present

## 2017-12-28 DIAGNOSIS — M25511 Pain in right shoulder: Secondary | ICD-10-CM | POA: Diagnosis not present

## 2017-12-28 DIAGNOSIS — Z87891 Personal history of nicotine dependence: Secondary | ICD-10-CM | POA: Insufficient documentation

## 2017-12-28 DIAGNOSIS — M25522 Pain in left elbow: Secondary | ICD-10-CM | POA: Diagnosis not present

## 2017-12-28 DIAGNOSIS — S4992XA Unspecified injury of left shoulder and upper arm, initial encounter: Secondary | ICD-10-CM | POA: Diagnosis not present

## 2017-12-28 DIAGNOSIS — Z79899 Other long term (current) drug therapy: Secondary | ICD-10-CM | POA: Insufficient documentation

## 2017-12-28 DIAGNOSIS — Y93H9 Activity, other involving exterior property and land maintenance, building and construction: Secondary | ICD-10-CM | POA: Diagnosis not present

## 2017-12-28 DIAGNOSIS — W108XXA Fall (on) (from) other stairs and steps, initial encounter: Secondary | ICD-10-CM | POA: Insufficient documentation

## 2017-12-28 DIAGNOSIS — I10 Essential (primary) hypertension: Secondary | ICD-10-CM | POA: Diagnosis not present

## 2017-12-28 DIAGNOSIS — Z23 Encounter for immunization: Secondary | ICD-10-CM | POA: Insufficient documentation

## 2017-12-28 DIAGNOSIS — Z7982 Long term (current) use of aspirin: Secondary | ICD-10-CM | POA: Diagnosis not present

## 2017-12-28 DIAGNOSIS — Z7901 Long term (current) use of anticoagulants: Secondary | ICD-10-CM | POA: Diagnosis not present

## 2017-12-28 DIAGNOSIS — S59902A Unspecified injury of left elbow, initial encounter: Secondary | ICD-10-CM | POA: Diagnosis not present

## 2017-12-28 DIAGNOSIS — Y999 Unspecified external cause status: Secondary | ICD-10-CM | POA: Insufficient documentation

## 2017-12-28 DIAGNOSIS — S90932A Unspecified superficial injury of left great toe, initial encounter: Secondary | ICD-10-CM | POA: Diagnosis present

## 2017-12-28 DIAGNOSIS — M25512 Pain in left shoulder: Secondary | ICD-10-CM | POA: Diagnosis not present

## 2017-12-28 MED ORDER — ACETAMINOPHEN 325 MG PO TABS
650.0000 mg | ORAL_TABLET | Freq: Once | ORAL | Status: AC
  Administered 2017-12-28: 650 mg via ORAL
  Filled 2017-12-28: qty 2

## 2017-12-28 MED ORDER — TETANUS-DIPHTH-ACELL PERTUSSIS 5-2.5-18.5 LF-MCG/0.5 IM SUSP
0.5000 mL | Freq: Once | INTRAMUSCULAR | Status: AC
  Administered 2017-12-28: 0.5 mL via INTRAMUSCULAR
  Filled 2017-12-28: qty 0.5

## 2017-12-28 NOTE — ED Triage Notes (Signed)
Pt presents with pain to L elbow and L shoulder after falling off deck today, landing on concrete.  -LOC  Pt fell off 4 stairs.

## 2017-12-28 NOTE — ED Notes (Signed)
Ice pack to left elbow and shoulder.

## 2017-12-28 NOTE — ED Provider Notes (Signed)
Patient placed in Quick Look pathway, seen and evaluated   Chief Complaint: Fall  HPI: Patient anticoagulated on Plavix presents after a fall onto outstretched arms today.  Patient states that he was working on his patio.  He has psoriatic arthritis at baseline and several objects on the ground which caused him to stumble and fall off of 4 stairs.  He fell onto to outstretched arms.  He did not hit his head or lose consciousness.  He has not developed any headache or neck pain.  Currently he has pain in his left elbow and shoulder.  He also sustained an abrasion to his left great toe.  No chest or abdominal pain.  No new back pain.  ROS:  Positive ROS: (+) shoulder and elbow pain, abrasion Negative ROS: (-)  HA, neck pain, LOC, vomiting  Physical Exam:   Gen: No distress  Neuro: Awake and Alert  Skin: Warm    Focused Exam: Heart RRR, nml S1,S2, no m/r/g; Lungs CTAB; Abd soft, NT, no rebound or guarding; Ext 2+ pedal and radial pulses bilaterally, no edema, mildly decreased range of motion of left shoulder and left elbow due to pain, no left wrist or hand pain, normal right upper extremity, abrasion noted to tip of toe with skin avulsion, full range of motion of toe without pain.   BP (!) 142/69 (BP Location: Right Arm)   Pulse 77   Temp 97.9 F (36.6 C) (Oral)   Resp 18   Ht 5\' 11"  (1.803 m)   Wt 92.5 kg (204 lb)   SpO2 99%   BMI 28.45 kg/m   Plan: X-rays pending.  Patient has no indication of head or neck injury at this time.  Do not feel that head CT or neck imaging is indicated unless symptoms change during ED stay.  Awaiting imaging.   Initiation of care has begun. The patient has been counseled on the process, plan, and necessity for staying for the completion/evaluation, and the remainder of the medical screening examination    Carlisle Cater, Hershal Coria 12/28/17 1951    Noemi Chapel, MD 12/29/17 [phone]

## 2017-12-28 NOTE — ED Provider Notes (Signed)
[facility] EMERGENCY DEPARTMENT Provider Note   CSN: [id number] Arrival date & time: 12/28/17  1854     History   Chief Complaint Chief Complaint  Patient presents with  . Fall    HPI Eric Hoffman is a 77 y.o. male with a past medical history of PVD, currently on Plavix, who presents to ED for evaluation of mechanical fall that occurred earlier today.  He was working on his patio when he tripped and slipped over several objects on the ground.  He fell forward off of 4 stairs.  He landed on his dominant left side.  He fell onto his outstretched arms.  He denies any head injury, loss of consciousness, neck pain.  He currently complains of left shoulder and elbow pain.  He has taken 1 dose of hydrocodone with improvement in his symptoms.  Denies any numbness in legs, loss of bowel or bladder function, lower back pain, neck pain, vision changes, vomiting, headache.  HPI  Past Medical History:  Diagnosis Date  . Allergic rhinitis   . Anxiety   . Dermatitis   . Heart murmur   . Hyperlipidemia   . Hypertension   . Leg pain, bilateral   . Malaria 1970 to 1982  . UTI (lower urinary tract infection)     Patient Active Problem List   Diagnosis Date Noted  . Peripheral vascular disease, unspecified ([place]) 09/20/2012  . Claudication in peripheral vascular disease ([place]) 08/25/2012  . Dysuria 03/11/2012  . PTSD (post-traumatic stress disorder) 10/30/2011  . Alcohol abuse, daily use 10/30/2011  . Tobacco abuse 10/30/2011  . Insomnia 10/30/2011  . ANXIETY 07/07/2010  . HYPERLIPIDEMIA 05/15/2008  . HYPERTENSION 05/15/2008  . ALLERGIC RHINITIS 05/15/2008    Past Surgical History:  Procedure Laterality Date  . COLONOSCOPY  12/2009  . TONSILLECTOMY  1950        Home Medications    Prior to Admission medications   Medication Sig Start Date End Date Taking? Authorizing Provider  ALPRAZolam Duanne Moron) 1 MG tablet TAKE 1 TABLET BY MOUTH THREE TIMES DAILY AS NEEDED  04/12/14   Marletta Lor, MD  aspirin 325 MG tablet Take 325 mg by mouth daily.    [provider]  cetirizine (ZYRTEC) 10 MG tablet Take 10 mg by mouth daily as needed. For allergies    [provider]  chlorthalidone (HYGROTON) 25 MG tablet TAKE 1 TABLET (25 MG TOTAL) BY MOUTH DAILY.    Marletta Lor, MD  doxycycline (VIBRAMYCIN) 100 MG capsule Take 1 capsule (100 mg total) by mouth 2 (two) times daily. One po bid x 7 days 08/08/17   Charlesetta Shanks, MD  fluticasone (FLONASE) 50 MCG/ACT nasal spray PLACE 2 SPRAYS INTO THE NOSE DAILY. 07/02/14   Marletta Lor, MD  meloxicam (MOBIC) 15 MG tablet TAKE 1 TABLET (15 MG TOTAL) BY MOUTH DAILY. 12/11/14   Marletta Lor, MD  OVER THE COUNTER MEDICATION Take 3 tablets by mouth daily. Instaflex    [provider]  Potassium Chloride ER 20 MEQ TBCR TAKE 1 TABLET (20 MEQ TOTAL) BY MOUTH DAILY.    Marletta Lor, MD  predniSONE (DELTASONE) 20 MG tablet Take 1 tablet (20 mg total) by mouth 2 (two) times daily with a meal. 12/05/13   Marletta Lor, MD  sertraline (ZOLOFT) 100 MG tablet Take 100 mg by mouth daily. 1/2 tablet per day    [provider]  sildenafil (VIAGRA) 100 MG tablet Take 100  mg by mouth daily as needed. For Erectile Dysfunction    [provider]  simvastatin (ZOCOR) 20 MG tablet Take 1 tablet (20 mg total) by mouth at bedtime. 12/05/13   Marletta Lor, MD  temazepam (RESTORIL) 15 MG capsule TAKE ONE OR TWO CAPSULES BY MOUTH AT BEDTIME AS NEEDED FOR SLEEP 04/02/14   Marletta Lor, MD  VIAGRA 100 MG tablet TAKE 1 TABLET (100 MG TOTAL) BY MOUTH AS DIRECTED. 04/23/14   Marletta Lor, MD  cimetidine (TAGAMET) 400 MG tablet Take 400 mg by mouth 2 (two) times daily as needed.  11/18/11  [provider]  metoprolol succinate (TOPROL-XL) 50 MG 24 hr tablet Take 1 tablet (50 mg total) by mouth daily. Take with or immediately following a meal.  10/28/11 11/18/11  Lyndee Hensen, MD  potassium chloride SA (K-DUR,KLOR-CON) 20 MEQ tablet Take 1 tablet (20 mEq total) by mouth daily. 07/17/13 12/16/13  Marletta Lor, MD    Family History Family History  Problem Relation Age of Onset  . Cancer Mother        breast  . Cancer Brother        brain   . Cancer Brother        renal cancer    Social History Social History   Tobacco Use  . Smoking status: Former Research scientist (life sciences)  . Smokeless tobacco: Never Used  Substance Use Topics  . Alcohol use: Yes    Alcohol/week: 14.4 oz    Types: 10 Glasses of wine, 14 Shots of liquor per week  . Drug use: No     Allergies   Benadryl [diphenhydramine hcl]; Effexor [venlafaxine hydrochloride]; and Lariam [mefloquine]   Review of Systems Review of Systems  Constitutional: Negative for appetite change, chills and fever.  HENT: Negative for ear pain, rhinorrhea, sneezing and sore throat.   Eyes: Negative for photophobia and visual disturbance.  Respiratory: Negative for cough, chest tightness, shortness of breath and wheezing.   Cardiovascular: Negative for chest pain and palpitations.  Gastrointestinal: Negative for abdominal pain, blood in stool, constipation, diarrhea, nausea and vomiting.  Genitourinary: Negative for dysuria, hematuria and urgency.  Musculoskeletal: Positive for arthralgias. Negative for back pain, myalgias and neck pain.  Skin: Positive for wound. Negative for rash.  Neurological: Negative for dizziness, syncope, weakness, light-headedness and headaches.     Physical Exam Updated Vital Signs BP 140/65   Pulse 65   Temp 97.9 F (36.6 C) (Oral)   Resp 17   Ht 5\' 11"  (1.803 m)   Wt 92.5 kg (204 lb)   SpO2 97%   BMI 28.45 kg/m   Physical Exam  Constitutional: He is oriented to person, place, and time. He appears well-developed and well-nourished. No distress.  HENT:  Head: Normocephalic and atraumatic.  Nose: Nose normal.  Eyes: Conjunctivae and EOM are  normal. Left eye exhibits no discharge. No scleral icterus.  Neck: Normal range of motion. Neck supple.  Cardiovascular: Normal rate, regular rhythm, normal heart sounds and intact distal pulses. Exam reveals no gallop and no friction rub.  No murmur heard. Pulmonary/Chest: Effort normal and breath sounds normal. No respiratory distress.  Abdominal: Soft. Bowel sounds are normal. He exhibits no distension. There is no tenderness. There is no guarding.  Musculoskeletal: Normal range of motion. He exhibits tenderness. He exhibits no edema.  Tenderness to palpation of the left elbow and left shoulder.  Able to perform full active and passive range of motion of left shoulder and elbow  without difficulty.  2+ radial pulse noted.  No visible deformity or erythema noted. No midline spinal tenderness present in lumbar, thoracic or cervical spine. No step-off palpated. No visible bruising, edema or temperature change noted. No objective signs of numbness present. No saddle anesthesia. 2+ DP pulses bilaterally. Sensation intact to light touch. Strength 5/5 in bilateral lower extremities.  Neurological: He is alert and oriented to person, place, and time. No cranial nerve deficit or sensory deficit. He exhibits normal muscle tone. Coordination normal.  Pupils reactive. No facial asymmetry noted. Cranial nerves appear grossly intact. Sensation intact to light touch on face, BUE and BLE. Strength 5/5 in BUE and BLE.  Skin: Skin is warm and dry. No rash noted.  Abrasion to left great toe.  Psychiatric: He has a normal mood and affect.  Nursing note and vitals reviewed.    ED Treatments / Results  Labs (all labs ordered are listed, but only abnormal results are displayed) Labs Reviewed - No data to display  EKG None  Radiology Dg Elbow Complete Left  Result Date: 12/28/2017 CLINICAL DATA:  Left elbow pain after fall. EXAM: LEFT ELBOW - COMPLETE 3+ VIEW COMPARISON:  None. FINDINGS: There is no evidence of  fracture, dislocation, or joint effusion. There is no evidence of arthropathy or other focal bone abnormality. Soft tissues are unremarkable. IMPRESSION: Normal left elbow. Electronically Signed   By: Marijo Conception, M.D.   On: 12/28/2017 21:08   Dg Shoulder Left  Result Date: 12/28/2017 CLINICAL DATA:  Left shoulder pain after fall. EXAM: LEFT SHOULDER - 2+ VIEW COMPARISON:  None. FINDINGS: There is no evidence of fracture or dislocation. There is no evidence of arthropathy or other focal bone abnormality. Soft tissues are unremarkable. IMPRESSION: Normal left shoulder. Electronically Signed   By: Marijo Conception, M.D.   On: 12/28/2017 21:09    Procedures Procedures (including critical care time)  Medications Ordered in ED Medications  Tdap (BOOSTRIX) injection 0.5 mL (0.5 mLs Intramuscular Given 12/28/17 2141)  acetaminophen (TYLENOL) tablet 650 mg (650 mg Oral Given 12/28/17 2200)     Initial Impression / Assessment and Plan / ED Course  I have reviewed the triage vital signs and the nursing notes.  Pertinent labs & imaging results that were available during my care of the patient were reviewed by me and considered in my medical decision making (see chart for details).     Patient presents to ED for evaluation of left shoulder and left elbow pain after mechanical fall that occurred prior to arrival.  Patient is anticoagulated on Plavix but denies any head injury, loss of consciousness, neck pain, lower back pain, changes in gait, loss of bowel or bladder function.  Left shoulder and elbow tenderness to palpation with no changes to range of motion.  No deficits on neurological examination.  No C, T or L-spine tenderness to palpation.  He has been ambulatory with normal gait.  X-ray of the left shoulder and elbow returned as negative.  No headache, vision changes, vomiting with concern for close head injury or neck injury.  Provided wound care for left great toe skin abrasion, tenderness and  advised him to follow-up with his PCP for further evaluation if symptoms persist.  Advised to return to ED for any severe worsening symptoms. Patient discussed with and seen by my attending, Dr. Tomi Bamberger.  Portions of this note were generated with Lobbyist. Dictation errors may occur despite best attempts at proofreading.   Final Clinical  Impressions(s) / ED Diagnoses   Final diagnoses:  Fall, initial encounter    ED Discharge Orders    None       Delia Heady, PA-C 12/28/17 2223    Dorie Rank, MD 12/29/17 [phone]

## 2018-01-07 DIAGNOSIS — E782 Mixed hyperlipidemia: Secondary | ICD-10-CM | POA: Diagnosis not present

## 2018-01-07 DIAGNOSIS — L4050 Arthropathic psoriasis, unspecified: Secondary | ICD-10-CM | POA: Diagnosis not present

## 2018-01-07 DIAGNOSIS — I10 Essential (primary) hypertension: Secondary | ICD-10-CM | POA: Diagnosis not present

## 2018-01-07 DIAGNOSIS — I739 Peripheral vascular disease, unspecified: Secondary | ICD-10-CM | POA: Diagnosis not present

## 2018-01-07 DIAGNOSIS — M109 Gout, unspecified: Secondary | ICD-10-CM | POA: Diagnosis not present

## 2018-01-07 DIAGNOSIS — Z0000 Encounter for general adult medical examination without abnormal findings: Secondary | ICD-10-CM | POA: Diagnosis not present

## 2018-01-07 DIAGNOSIS — Z1211 Encounter for screening for malignant neoplasm of colon: Secondary | ICD-10-CM | POA: Diagnosis not present

## 2018-01-07 DIAGNOSIS — G894 Chronic pain syndrome: Secondary | ICD-10-CM | POA: Diagnosis not present

## 2018-01-07 DIAGNOSIS — M545 Low back pain: Secondary | ICD-10-CM | POA: Diagnosis not present

## 2018-01-07 DIAGNOSIS — S91101A Unspecified open wound of right great toe without damage to nail, initial encounter: Secondary | ICD-10-CM | POA: Diagnosis not present

## 2018-01-07 DIAGNOSIS — Z8669 Personal history of other diseases of the nervous system and sense organs: Secondary | ICD-10-CM | POA: Diagnosis not present

## 2018-01-07 DIAGNOSIS — Z125 Encounter for screening for malignant neoplasm of prostate: Secondary | ICD-10-CM | POA: Diagnosis not present

## 2018-01-12 DIAGNOSIS — M25512 Pain in left shoulder: Secondary | ICD-10-CM | POA: Diagnosis not present

## 2018-01-12 DIAGNOSIS — M25532 Pain in left wrist: Secondary | ICD-10-CM | POA: Diagnosis not present

## 2018-01-12 DIAGNOSIS — M545 Low back pain: Secondary | ICD-10-CM | POA: Diagnosis not present

## 2018-01-20 DIAGNOSIS — M25519 Pain in unspecified shoulder: Secondary | ICD-10-CM | POA: Diagnosis not present

## 2018-01-20 DIAGNOSIS — M25512 Pain in left shoulder: Secondary | ICD-10-CM | POA: Diagnosis not present

## 2018-01-28 DIAGNOSIS — M545 Low back pain: Secondary | ICD-10-CM | POA: Diagnosis not present

## 2018-01-28 DIAGNOSIS — M25512 Pain in left shoulder: Secondary | ICD-10-CM | POA: Diagnosis not present

## 2018-02-11 DIAGNOSIS — M47816 Spondylosis without myelopathy or radiculopathy, lumbar region: Secondary | ICD-10-CM | POA: Diagnosis not present

## 2018-02-11 DIAGNOSIS — M5416 Radiculopathy, lumbar region: Secondary | ICD-10-CM | POA: Diagnosis not present

## 2018-02-11 DIAGNOSIS — M5136 Other intervertebral disc degeneration, lumbar region: Secondary | ICD-10-CM | POA: Diagnosis not present

## 2018-03-02 DIAGNOSIS — M5136 Other intervertebral disc degeneration, lumbar region: Secondary | ICD-10-CM | POA: Diagnosis not present

## 2018-03-02 DIAGNOSIS — E663 Overweight: Secondary | ICD-10-CM | POA: Diagnosis not present

## 2018-03-02 DIAGNOSIS — R21 Rash and other nonspecific skin eruption: Secondary | ICD-10-CM | POA: Diagnosis not present

## 2018-03-02 DIAGNOSIS — Z6828 Body mass index (BMI) 28.0-28.9, adult: Secondary | ICD-10-CM | POA: Diagnosis not present

## 2018-03-02 DIAGNOSIS — M1A09X0 Idiopathic chronic gout, multiple sites, without tophus (tophi): Secondary | ICD-10-CM | POA: Diagnosis not present

## 2018-03-02 DIAGNOSIS — L401 Generalized pustular psoriasis: Secondary | ICD-10-CM | POA: Diagnosis not present

## 2018-03-02 DIAGNOSIS — S22080A Wedge compression fracture of T11-T12 vertebra, initial encounter for closed fracture: Secondary | ICD-10-CM | POA: Diagnosis not present

## 2018-03-02 DIAGNOSIS — L4059 Other psoriatic arthropathy: Secondary | ICD-10-CM | POA: Diagnosis not present

## 2018-03-07 DIAGNOSIS — M720 Palmar fascial fibromatosis [Dupuytren]: Secondary | ICD-10-CM | POA: Diagnosis not present

## 2018-03-24 DIAGNOSIS — M720 Palmar fascial fibromatosis [Dupuytren]: Secondary | ICD-10-CM | POA: Diagnosis not present

## 2018-03-30 DIAGNOSIS — Z6827 Body mass index (BMI) 27.0-27.9, adult: Secondary | ICD-10-CM | POA: Diagnosis not present

## 2018-03-30 DIAGNOSIS — R030 Elevated blood-pressure reading, without diagnosis of hypertension: Secondary | ICD-10-CM | POA: Diagnosis not present

## 2018-03-30 DIAGNOSIS — M48061 Spinal stenosis, lumbar region without neurogenic claudication: Secondary | ICD-10-CM | POA: Diagnosis not present

## 2018-04-26 DIAGNOSIS — M25561 Pain in right knee: Secondary | ICD-10-CM | POA: Diagnosis not present

## 2018-04-26 DIAGNOSIS — M5416 Radiculopathy, lumbar region: Secondary | ICD-10-CM | POA: Diagnosis not present

## 2018-04-26 DIAGNOSIS — M48061 Spinal stenosis, lumbar region without neurogenic claudication: Secondary | ICD-10-CM | POA: Diagnosis not present

## 2018-04-26 DIAGNOSIS — G8929 Other chronic pain: Secondary | ICD-10-CM | POA: Diagnosis not present

## 2018-05-04 DIAGNOSIS — M5416 Radiculopathy, lumbar region: Secondary | ICD-10-CM | POA: Diagnosis not present

## 2018-05-18 DIAGNOSIS — M5416 Radiculopathy, lumbar region: Secondary | ICD-10-CM | POA: Diagnosis not present

## 2018-06-02 DIAGNOSIS — S22080A Wedge compression fracture of T11-T12 vertebra, initial encounter for closed fracture: Secondary | ICD-10-CM | POA: Diagnosis not present

## 2018-06-02 DIAGNOSIS — Z6828 Body mass index (BMI) 28.0-28.9, adult: Secondary | ICD-10-CM | POA: Diagnosis not present

## 2018-06-02 DIAGNOSIS — M5136 Other intervertebral disc degeneration, lumbar region: Secondary | ICD-10-CM | POA: Diagnosis not present

## 2018-06-02 DIAGNOSIS — L4059 Other psoriatic arthropathy: Secondary | ICD-10-CM | POA: Diagnosis not present

## 2018-06-02 DIAGNOSIS — M1A09X0 Idiopathic chronic gout, multiple sites, without tophus (tophi): Secondary | ICD-10-CM | POA: Diagnosis not present

## 2018-06-02 DIAGNOSIS — E663 Overweight: Secondary | ICD-10-CM | POA: Diagnosis not present

## 2018-06-02 DIAGNOSIS — L401 Generalized pustular psoriasis: Secondary | ICD-10-CM | POA: Diagnosis not present

## 2018-06-20 DIAGNOSIS — M47817 Spondylosis without myelopathy or radiculopathy, lumbosacral region: Secondary | ICD-10-CM | POA: Diagnosis not present

## 2018-06-20 DIAGNOSIS — G894 Chronic pain syndrome: Secondary | ICD-10-CM | POA: Diagnosis not present

## 2018-06-20 DIAGNOSIS — M48061 Spinal stenosis, lumbar region without neurogenic claudication: Secondary | ICD-10-CM | POA: Diagnosis not present

## 2018-06-20 DIAGNOSIS — L4059 Other psoriatic arthropathy: Secondary | ICD-10-CM | POA: Diagnosis not present

## 2018-06-20 DIAGNOSIS — Z79891 Long term (current) use of opiate analgesic: Secondary | ICD-10-CM | POA: Diagnosis not present

## 2018-06-30 DIAGNOSIS — Z23 Encounter for immunization: Secondary | ICD-10-CM | POA: Diagnosis not present

## 2018-07-06 ENCOUNTER — Other Ambulatory Visit (HOSPITAL_BASED_OUTPATIENT_CLINIC_OR_DEPARTMENT_OTHER): Payer: Self-pay

## 2018-07-06 DIAGNOSIS — R0683 Snoring: Secondary | ICD-10-CM

## 2018-07-06 DIAGNOSIS — G4710 Hypersomnia, unspecified: Secondary | ICD-10-CM

## 2018-07-06 DIAGNOSIS — R5383 Other fatigue: Secondary | ICD-10-CM

## 2018-07-12 ENCOUNTER — Other Ambulatory Visit: Payer: Self-pay

## 2018-07-12 DIAGNOSIS — M1711 Unilateral primary osteoarthritis, right knee: Secondary | ICD-10-CM | POA: Diagnosis not present

## 2018-07-18 ENCOUNTER — Ambulatory Visit (HOSPITAL_BASED_OUTPATIENT_CLINIC_OR_DEPARTMENT_OTHER): Payer: Medicare Other

## 2018-07-18 DIAGNOSIS — M47817 Spondylosis without myelopathy or radiculopathy, lumbosacral region: Secondary | ICD-10-CM | POA: Diagnosis not present

## 2018-07-18 DIAGNOSIS — G894 Chronic pain syndrome: Secondary | ICD-10-CM | POA: Diagnosis not present

## 2018-07-18 DIAGNOSIS — M48062 Spinal stenosis, lumbar region with neurogenic claudication: Secondary | ICD-10-CM | POA: Diagnosis not present

## 2018-07-18 DIAGNOSIS — L4059 Other psoriatic arthropathy: Secondary | ICD-10-CM | POA: Diagnosis not present

## 2018-07-25 ENCOUNTER — Ambulatory Visit (HOSPITAL_BASED_OUTPATIENT_CLINIC_OR_DEPARTMENT_OTHER): Payer: Medicare Other | Attending: Physical Medicine and Rehabilitation | Admitting: Internal Medicine

## 2018-07-25 VITALS — Ht 71.0 in | Wt 199.0 lb

## 2018-07-25 DIAGNOSIS — G4736 Sleep related hypoventilation in conditions classified elsewhere: Secondary | ICD-10-CM | POA: Diagnosis not present

## 2018-07-25 DIAGNOSIS — R400 Somnolence: Secondary | ICD-10-CM | POA: Diagnosis not present

## 2018-07-25 DIAGNOSIS — R5383 Other fatigue: Secondary | ICD-10-CM | POA: Insufficient documentation

## 2018-07-25 DIAGNOSIS — R0683 Snoring: Secondary | ICD-10-CM | POA: Diagnosis present

## 2018-07-25 DIAGNOSIS — G4733 Obstructive sleep apnea (adult) (pediatric): Secondary | ICD-10-CM

## 2018-07-27 ENCOUNTER — Other Ambulatory Visit (HOSPITAL_BASED_OUTPATIENT_CLINIC_OR_DEPARTMENT_OTHER): Payer: Self-pay

## 2018-07-27 DIAGNOSIS — R0683 Snoring: Secondary | ICD-10-CM

## 2018-07-27 DIAGNOSIS — G4710 Hypersomnia, unspecified: Secondary | ICD-10-CM

## 2018-07-27 DIAGNOSIS — R5383 Other fatigue: Secondary | ICD-10-CM

## 2018-08-06 NOTE — Procedures (Signed)
  Patient Name: Eric Hoffman, Eric Hoffman Date: 07/26/2018 Gender: Male D.O.B: [birth year] Age (years): 77 Referring Provider: Margaretha Sheffield Height (inches): 28 Interpreting Physician: Baird Lyons MD, ABSM Weight (lbs): 199 RPSGT: Jacolyn Reedy BMI: 28 MRN: [id number] Neck Size: 16.50  CLINICAL INFORMATION Sleep Study Type: HST Indication for sleep study: Excessive Daytime Sleepiness, Fatigue, Snoring  Epworth Sleepiness Score: 6  SLEEP STUDY TECHNIQUE A multi-channel overnight portable sleep study was performed. The channels recorded were: nasal airflow, thoracic respiratory movement, and oxygen saturation with a pulse oximetry. Snoring was also monitored.  MEDICATIONS Patient self administered medications include: none reported.  SLEEP ARCHITECTURE Patient was studied for 467.7 minutes. The sleep efficiency was 100.0 % and the patient was supine for 0%. The arousal index was 0.0 per hour.  RESPIRATORY PARAMETERS The overall AHI was 9.1 per hour, with a central apnea index of 0.0 per hour. The oxygen nadir was 80% during sleep.  CARDIAC DATA Mean heart rate during sleep was 55.6 bpm.  IMPRESSIONS - Mild obstructive sleep apnea occurred during this study (AHI = 9.1/h). - No significant central sleep apnea occurred during this study (CAI = 0.0/h). - Oxygen desaturation was noted during this study (Min O2 = 80%). Mean 90%. Time with sat </= 89% was 113 minutes. - Patient snored.  DIAGNOSIS - Obstructive Sleep Apnea (327.23 [G47.33 ICD-10]) - Nocturnal Hypoxemia (327.26 [G47.36 ICD-10])  RECOMMENDATIONS - Treatment for mild OSA should include consideration of nocturnal hypoxemia, which suggests underlying cardiopulmonary disease or hypoventilation.  - Consider formal CPAP titration sleep study, which will indicate if hypoxemia persists after control of OSA. Sleep medical consultation available if needed. - Avoid alcohol, sedatives and other CNS depressants that may worsen  sleep apnea and disrupt normal sleep architecture. - Sleep hygiene should be reviewed to assess factors that may improve sleep quality. - Weight management and regular exercise should be initiated or continued.  [Electronically signed] 08/06/2018 11:03 AM  Baird Lyons MD, ABSM Diplomate, American Board of Sleep Medicine   NPI: [id number]                           [place], [place] [facility]  ELECTRONICALLY SIGNED ON:  08/06/2018, 10:59 AM [place] PH: [phone]   FX: [phone] [place]

## 2018-08-15 DIAGNOSIS — M48062 Spinal stenosis, lumbar region with neurogenic claudication: Secondary | ICD-10-CM | POA: Diagnosis not present

## 2018-08-15 DIAGNOSIS — G894 Chronic pain syndrome: Secondary | ICD-10-CM | POA: Diagnosis not present

## 2018-08-15 DIAGNOSIS — L4059 Other psoriatic arthropathy: Secondary | ICD-10-CM | POA: Diagnosis not present

## 2018-08-15 DIAGNOSIS — M47817 Spondylosis without myelopathy or radiculopathy, lumbosacral region: Secondary | ICD-10-CM | POA: Diagnosis not present

## 2018-09-08 DIAGNOSIS — M47816 Spondylosis without myelopathy or radiculopathy, lumbar region: Secondary | ICD-10-CM | POA: Diagnosis not present

## 2018-09-12 DIAGNOSIS — M48062 Spinal stenosis, lumbar region with neurogenic claudication: Secondary | ICD-10-CM | POA: Diagnosis not present

## 2018-09-12 DIAGNOSIS — L4059 Other psoriatic arthropathy: Secondary | ICD-10-CM | POA: Diagnosis not present

## 2018-09-12 DIAGNOSIS — M47817 Spondylosis without myelopathy or radiculopathy, lumbosacral region: Secondary | ICD-10-CM | POA: Diagnosis not present

## 2018-09-12 DIAGNOSIS — G894 Chronic pain syndrome: Secondary | ICD-10-CM | POA: Diagnosis not present

## 2018-09-29 DIAGNOSIS — M47816 Spondylosis without myelopathy or radiculopathy, lumbar region: Secondary | ICD-10-CM | POA: Diagnosis not present

## 2018-09-30 DIAGNOSIS — Z9889 Other specified postprocedural states: Secondary | ICD-10-CM | POA: Diagnosis not present

## 2018-09-30 DIAGNOSIS — I6523 Occlusion and stenosis of bilateral carotid arteries: Secondary | ICD-10-CM | POA: Diagnosis not present

## 2018-10-10 DIAGNOSIS — L4059 Other psoriatic arthropathy: Secondary | ICD-10-CM | POA: Diagnosis not present

## 2018-10-10 DIAGNOSIS — M47817 Spondylosis without myelopathy or radiculopathy, lumbosacral region: Secondary | ICD-10-CM | POA: Diagnosis not present

## 2018-10-10 DIAGNOSIS — G894 Chronic pain syndrome: Secondary | ICD-10-CM | POA: Diagnosis not present

## 2018-10-10 DIAGNOSIS — M48062 Spinal stenosis, lumbar region with neurogenic claudication: Secondary | ICD-10-CM | POA: Diagnosis not present

## 2018-10-11 DIAGNOSIS — M1711 Unilateral primary osteoarthritis, right knee: Secondary | ICD-10-CM | POA: Diagnosis not present

## 2018-10-24 IMAGING — DX DG SHOULDER 2+V*L*
2 series · 2 of 2 positions shown · non-contrast
Comparison: None.

CLINICAL DATA: Left shoulder pain after fall.

EXAM:
LEFT SHOULDER - 2+ VIEW

[shoulder grashey]
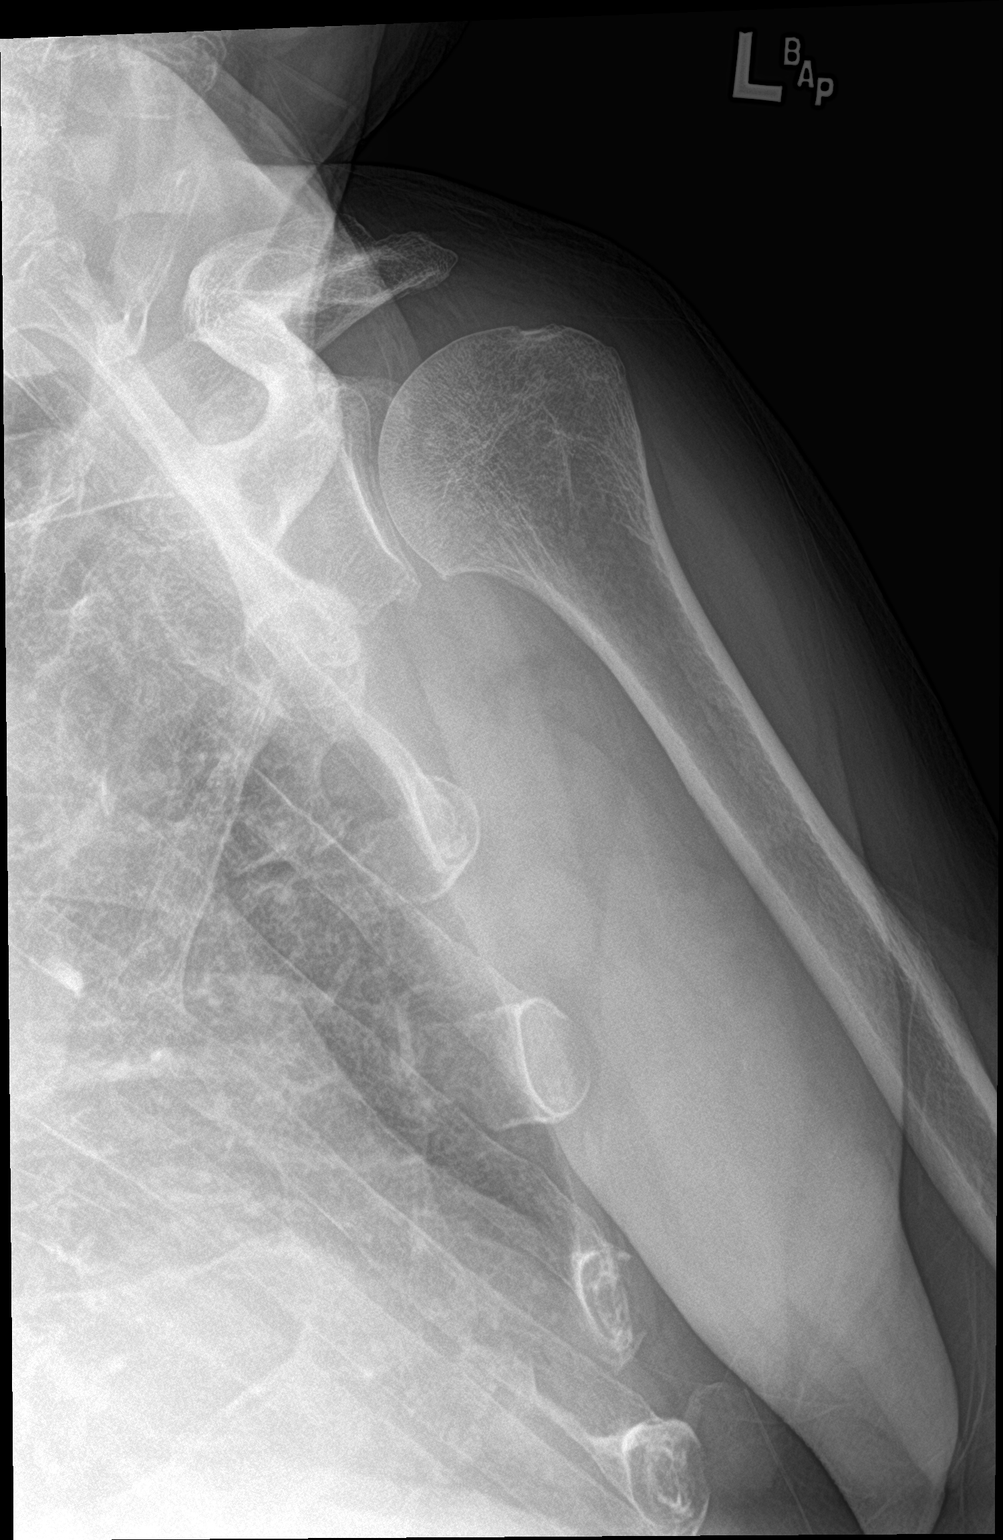

[shoulder y view]
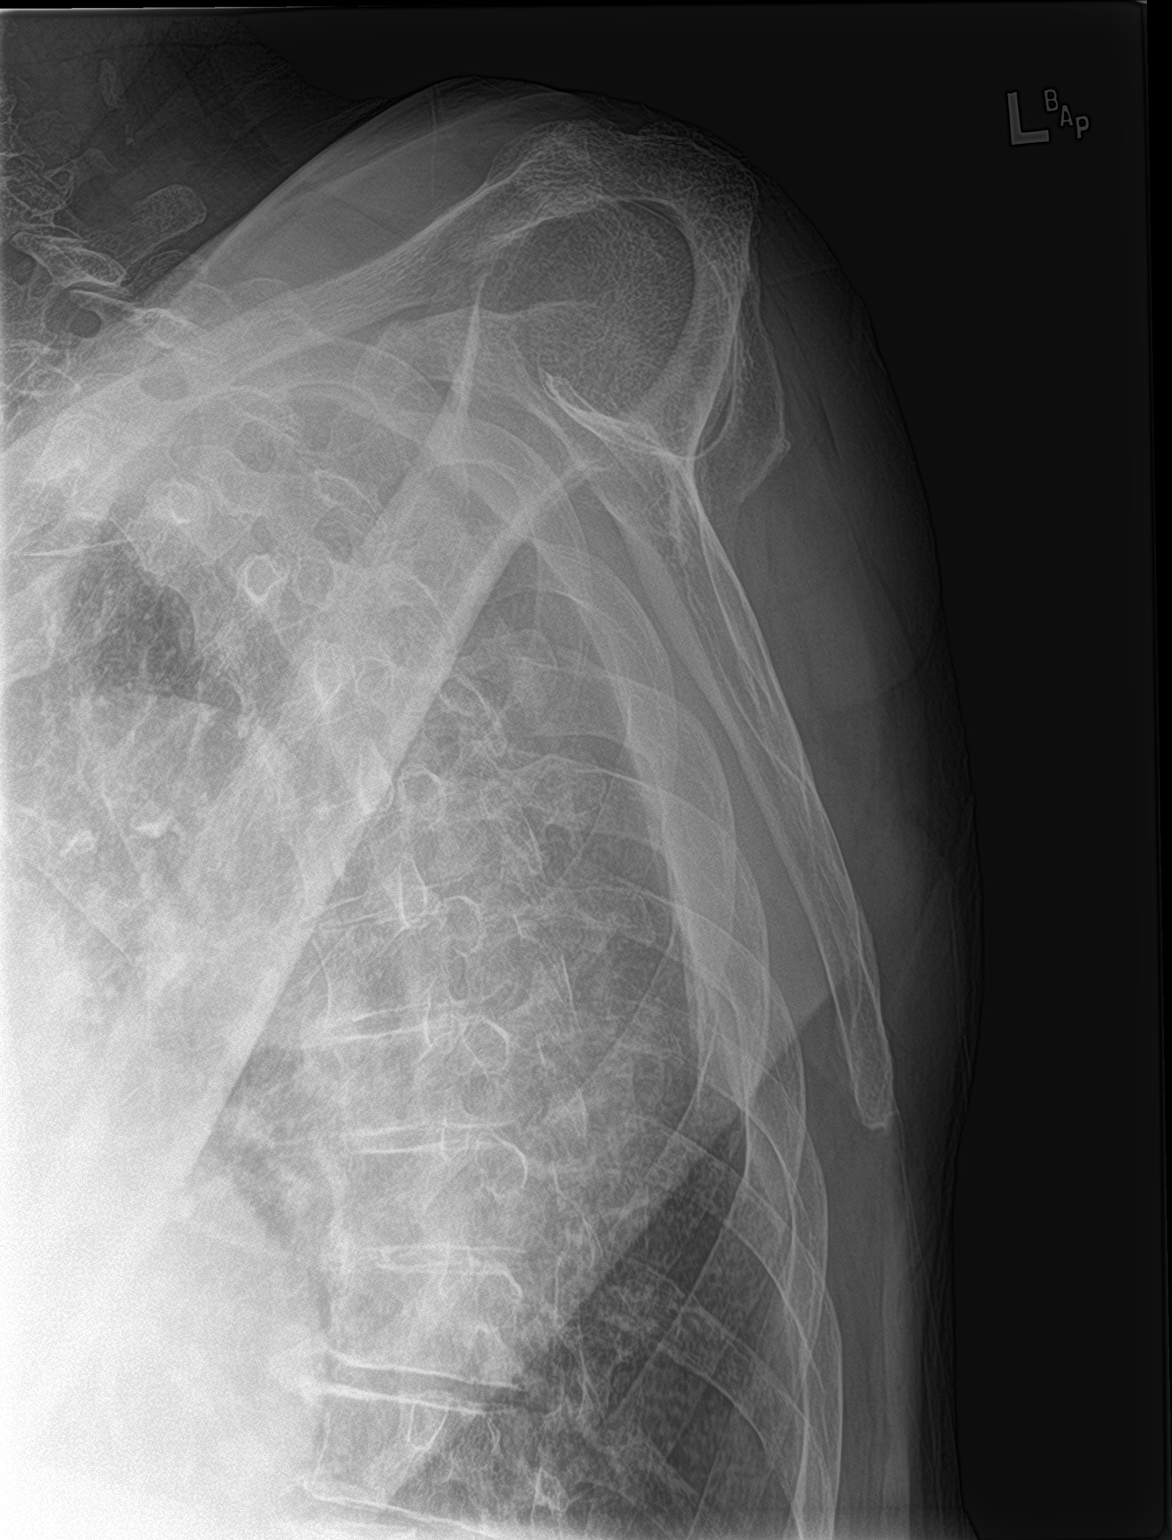

[2 of 2 positions shown; findings below may reference images not displayed]

FINDINGS: There is no evidence of fracture or dislocation. There is no
evidence of arthropathy or other focal bone abnormality. Soft
tissues are unremarkable.
IMPRESSION: Normal left shoulder.

## 2018-11-07 DIAGNOSIS — M48062 Spinal stenosis, lumbar region with neurogenic claudication: Secondary | ICD-10-CM | POA: Diagnosis not present

## 2018-11-07 DIAGNOSIS — M47817 Spondylosis without myelopathy or radiculopathy, lumbosacral region: Secondary | ICD-10-CM | POA: Diagnosis not present

## 2018-11-07 DIAGNOSIS — L4059 Other psoriatic arthropathy: Secondary | ICD-10-CM | POA: Diagnosis not present

## 2018-11-07 DIAGNOSIS — G894 Chronic pain syndrome: Secondary | ICD-10-CM | POA: Diagnosis not present

## 2018-12-05 DIAGNOSIS — M47817 Spondylosis without myelopathy or radiculopathy, lumbosacral region: Secondary | ICD-10-CM | POA: Diagnosis not present

## 2018-12-05 DIAGNOSIS — G894 Chronic pain syndrome: Secondary | ICD-10-CM | POA: Diagnosis not present

## 2018-12-05 DIAGNOSIS — M48061 Spinal stenosis, lumbar region without neurogenic claudication: Secondary | ICD-10-CM | POA: Diagnosis not present

## 2018-12-05 DIAGNOSIS — L4059 Other psoriatic arthropathy: Secondary | ICD-10-CM | POA: Diagnosis not present

## 2019-01-02 DIAGNOSIS — M48062 Spinal stenosis, lumbar region with neurogenic claudication: Secondary | ICD-10-CM | POA: Diagnosis not present

## 2019-01-02 DIAGNOSIS — G894 Chronic pain syndrome: Secondary | ICD-10-CM | POA: Diagnosis not present

## 2019-01-02 DIAGNOSIS — M1712 Unilateral primary osteoarthritis, left knee: Secondary | ICD-10-CM | POA: Diagnosis not present

## 2019-01-02 DIAGNOSIS — L4059 Other psoriatic arthropathy: Secondary | ICD-10-CM | POA: Diagnosis not present

## 2019-01-02 DIAGNOSIS — M47817 Spondylosis without myelopathy or radiculopathy, lumbosacral region: Secondary | ICD-10-CM | POA: Diagnosis not present

## 2019-02-15 DIAGNOSIS — M47817 Spondylosis without myelopathy or radiculopathy, lumbosacral region: Secondary | ICD-10-CM | POA: Diagnosis not present

## 2019-02-15 DIAGNOSIS — M48061 Spinal stenosis, lumbar region without neurogenic claudication: Secondary | ICD-10-CM | POA: Diagnosis not present

## 2019-02-15 DIAGNOSIS — G894 Chronic pain syndrome: Secondary | ICD-10-CM | POA: Diagnosis not present

## 2019-02-15 DIAGNOSIS — L4059 Other psoriatic arthropathy: Secondary | ICD-10-CM | POA: Diagnosis not present

## 2019-03-14 DIAGNOSIS — I10 Essential (primary) hypertension: Secondary | ICD-10-CM | POA: Diagnosis not present

## 2019-03-14 DIAGNOSIS — E7841 Elevated Lipoprotein(a): Secondary | ICD-10-CM | POA: Diagnosis not present

## 2019-03-14 DIAGNOSIS — Z1389 Encounter for screening for other disorder: Secondary | ICD-10-CM | POA: Diagnosis not present

## 2019-03-15 DIAGNOSIS — L4059 Other psoriatic arthropathy: Secondary | ICD-10-CM | POA: Diagnosis not present

## 2019-03-15 DIAGNOSIS — M47817 Spondylosis without myelopathy or radiculopathy, lumbosacral region: Secondary | ICD-10-CM | POA: Diagnosis not present

## 2019-03-15 DIAGNOSIS — M48062 Spinal stenosis, lumbar region with neurogenic claudication: Secondary | ICD-10-CM | POA: Diagnosis not present

## 2019-03-15 DIAGNOSIS — G894 Chronic pain syndrome: Secondary | ICD-10-CM | POA: Diagnosis not present

## 2019-04-11 DIAGNOSIS — M47816 Spondylosis without myelopathy or radiculopathy, lumbar region: Secondary | ICD-10-CM | POA: Diagnosis not present

## 2019-04-13 DIAGNOSIS — Z23 Encounter for immunization: Secondary | ICD-10-CM | POA: Diagnosis not present

## 2019-05-09 DIAGNOSIS — M48061 Spinal stenosis, lumbar region without neurogenic claudication: Secondary | ICD-10-CM | POA: Diagnosis not present

## 2019-05-09 DIAGNOSIS — G894 Chronic pain syndrome: Secondary | ICD-10-CM | POA: Diagnosis not present

## 2019-05-09 DIAGNOSIS — M47817 Spondylosis without myelopathy or radiculopathy, lumbosacral region: Secondary | ICD-10-CM | POA: Diagnosis not present

## 2019-05-09 DIAGNOSIS — L4059 Other psoriatic arthropathy: Secondary | ICD-10-CM | POA: Diagnosis not present

## 2019-06-01 DIAGNOSIS — Z79899 Other long term (current) drug therapy: Secondary | ICD-10-CM | POA: Diagnosis not present

## 2019-06-01 DIAGNOSIS — I739 Peripheral vascular disease, unspecified: Secondary | ICD-10-CM | POA: Diagnosis not present

## 2019-06-01 DIAGNOSIS — R5383 Other fatigue: Secondary | ICD-10-CM | POA: Diagnosis not present

## 2019-06-01 DIAGNOSIS — I10 Essential (primary) hypertension: Secondary | ICD-10-CM | POA: Diagnosis not present

## 2019-06-01 DIAGNOSIS — M109 Gout, unspecified: Secondary | ICD-10-CM | POA: Diagnosis not present

## 2019-06-01 DIAGNOSIS — E782 Mixed hyperlipidemia: Secondary | ICD-10-CM | POA: Diagnosis not present

## 2019-06-01 DIAGNOSIS — Z125 Encounter for screening for malignant neoplasm of prostate: Secondary | ICD-10-CM | POA: Diagnosis not present

## 2019-06-01 DIAGNOSIS — J069 Acute upper respiratory infection, unspecified: Secondary | ICD-10-CM | POA: Diagnosis not present

## 2019-06-07 DIAGNOSIS — L4059 Other psoriatic arthropathy: Secondary | ICD-10-CM | POA: Diagnosis not present

## 2019-06-07 DIAGNOSIS — M48061 Spinal stenosis, lumbar region without neurogenic claudication: Secondary | ICD-10-CM | POA: Diagnosis not present

## 2019-06-07 DIAGNOSIS — G894 Chronic pain syndrome: Secondary | ICD-10-CM | POA: Diagnosis not present

## 2019-06-07 DIAGNOSIS — M47817 Spondylosis without myelopathy or radiculopathy, lumbosacral region: Secondary | ICD-10-CM | POA: Diagnosis not present

## 2019-07-05 DIAGNOSIS — M48061 Spinal stenosis, lumbar region without neurogenic claudication: Secondary | ICD-10-CM | POA: Diagnosis not present

## 2019-07-05 DIAGNOSIS — L4059 Other psoriatic arthropathy: Secondary | ICD-10-CM | POA: Diagnosis not present

## 2019-07-05 DIAGNOSIS — M65331 Trigger finger, right middle finger: Secondary | ICD-10-CM | POA: Diagnosis not present

## 2019-07-05 DIAGNOSIS — M47817 Spondylosis without myelopathy or radiculopathy, lumbosacral region: Secondary | ICD-10-CM | POA: Diagnosis not present

## 2019-07-05 DIAGNOSIS — G894 Chronic pain syndrome: Secondary | ICD-10-CM | POA: Diagnosis not present

## 2019-09-19 ENCOUNTER — Ambulatory Visit: Payer: Medicare Other

## 2019-09-28 ENCOUNTER — Ambulatory Visit: Payer: Medicare Other | Attending: Internal Medicine

## 2019-09-28 DIAGNOSIS — Z23 Encounter for immunization: Secondary | ICD-10-CM | POA: Insufficient documentation

## 2019-09-28 NOTE — Progress Notes (Signed)
   Covid-19 Vaccination Clinic  Name:  Eric Hoffman    MRN: [id number] DOB: [birth year]  09/28/2019  [name] was observed post Covid-19 immunization for 15 minutes without incidence. He was provided with Vaccine Information Sheet and instruction to access the V-Safe system.   [name] was instructed to call 911 with any severe reactions post vaccine: Marland Kitchen Difficulty breathing  . Swelling of your face and throat  . A fast heartbeat  . A bad rash all over your body  . Dizziness and weakness    Immunizations Administered    Name Date Dose VIS Date Route   Pfizer COVID-19 Vaccine 09/28/2019  6:15 PM 0.3 mL 08/04/2019 Intramuscular   Manufacturer: [place]   Lot: [id number]   [place]: [id number]

## 2019-10-24 ENCOUNTER — Ambulatory Visit: Payer: Medicare Other | Attending: Internal Medicine

## 2019-10-24 DIAGNOSIS — Z23 Encounter for immunization: Secondary | ICD-10-CM | POA: Insufficient documentation

## 2019-10-24 NOTE — Progress Notes (Signed)
   Covid-19 Vaccination Clinic  Name:  Eric Hoffman    MRN: [id number] DOB: 12/15/1940  10/24/2019  Eric Hoffman was observed post Covid-19 immunization for 15 minutes without incident. He was provided with Vaccine Information Sheet and instruction to access the V-Safe system.   Eric Hoffman was instructed to call 911 with any severe reactions post vaccine: Marland Kitchen Difficulty breathing  . Swelling of face and throat  . A fast heartbeat  . A bad rash all over body  . Dizziness and weakness   Immunizations Administered    Name Date Dose VIS Date Route   Pfizer COVID-19 Vaccine 10/24/2019  3:56 PM 0.3 mL 08/04/2019 Intramuscular   Manufacturer: [place]   Lot: [id number]   [place]: [id number]

## 2019-11-07 DIAGNOSIS — Z7902 Long term (current) use of antithrombotics/antiplatelets: Secondary | ICD-10-CM | POA: Diagnosis not present

## 2019-11-07 DIAGNOSIS — I6521 Occlusion and stenosis of right carotid artery: Secondary | ICD-10-CM | POA: Diagnosis not present

## 2019-11-07 DIAGNOSIS — Z7982 Long term (current) use of aspirin: Secondary | ICD-10-CM | POA: Diagnosis not present

## 2019-11-07 DIAGNOSIS — I779 Disorder of arteries and arterioles, unspecified: Secondary | ICD-10-CM | POA: Diagnosis not present

## 2019-11-07 DIAGNOSIS — I6523 Occlusion and stenosis of bilateral carotid arteries: Secondary | ICD-10-CM | POA: Diagnosis not present

## 2019-11-07 DIAGNOSIS — Z9889 Other specified postprocedural states: Secondary | ICD-10-CM | POA: Diagnosis not present

## 2019-11-07 DIAGNOSIS — E785 Hyperlipidemia, unspecified: Secondary | ICD-10-CM | POA: Diagnosis not present

## 2019-12-05 DIAGNOSIS — I739 Peripheral vascular disease, unspecified: Secondary | ICD-10-CM | POA: Diagnosis not present

## 2019-12-05 DIAGNOSIS — Z0000 Encounter for general adult medical examination without abnormal findings: Secondary | ICD-10-CM | POA: Diagnosis not present

## 2019-12-05 DIAGNOSIS — E782 Mixed hyperlipidemia: Secondary | ICD-10-CM | POA: Diagnosis not present

## 2019-12-05 DIAGNOSIS — Z79899 Other long term (current) drug therapy: Secondary | ICD-10-CM | POA: Diagnosis not present

## 2019-12-05 DIAGNOSIS — G894 Chronic pain syndrome: Secondary | ICD-10-CM | POA: Diagnosis not present

## 2019-12-05 DIAGNOSIS — I10 Essential (primary) hypertension: Secondary | ICD-10-CM | POA: Diagnosis not present

## 2019-12-05 DIAGNOSIS — Z125 Encounter for screening for malignant neoplasm of prostate: Secondary | ICD-10-CM | POA: Diagnosis not present

## 2019-12-05 DIAGNOSIS — F329 Major depressive disorder, single episode, unspecified: Secondary | ICD-10-CM | POA: Diagnosis not present

## 2019-12-27 DIAGNOSIS — M47817 Spondylosis without myelopathy or radiculopathy, lumbosacral region: Secondary | ICD-10-CM | POA: Diagnosis not present

## 2019-12-27 DIAGNOSIS — G894 Chronic pain syndrome: Secondary | ICD-10-CM | POA: Diagnosis not present

## 2019-12-27 DIAGNOSIS — L4059 Other psoriatic arthropathy: Secondary | ICD-10-CM | POA: Diagnosis not present

## 2019-12-27 DIAGNOSIS — M48061 Spinal stenosis, lumbar region without neurogenic claudication: Secondary | ICD-10-CM | POA: Diagnosis not present

## 2020-01-24 DIAGNOSIS — M47817 Spondylosis without myelopathy or radiculopathy, lumbosacral region: Secondary | ICD-10-CM | POA: Diagnosis not present

## 2020-01-24 DIAGNOSIS — G894 Chronic pain syndrome: Secondary | ICD-10-CM | POA: Diagnosis not present

## 2020-01-24 DIAGNOSIS — M48061 Spinal stenosis, lumbar region without neurogenic claudication: Secondary | ICD-10-CM | POA: Diagnosis not present

## 2020-01-24 DIAGNOSIS — L4059 Other psoriatic arthropathy: Secondary | ICD-10-CM | POA: Diagnosis not present

## 2020-03-20 DIAGNOSIS — M47817 Spondylosis without myelopathy or radiculopathy, lumbosacral region: Secondary | ICD-10-CM | POA: Diagnosis not present

## 2020-03-20 DIAGNOSIS — Z79891 Long term (current) use of opiate analgesic: Secondary | ICD-10-CM | POA: Diagnosis not present

## 2020-03-20 DIAGNOSIS — M48061 Spinal stenosis, lumbar region without neurogenic claudication: Secondary | ICD-10-CM | POA: Diagnosis not present

## 2020-03-20 DIAGNOSIS — L4059 Other psoriatic arthropathy: Secondary | ICD-10-CM | POA: Diagnosis not present

## 2020-03-20 DIAGNOSIS — G894 Chronic pain syndrome: Secondary | ICD-10-CM | POA: Diagnosis not present

## 2020-04-19 DIAGNOSIS — M47817 Spondylosis without myelopathy or radiculopathy, lumbosacral region: Secondary | ICD-10-CM | POA: Diagnosis not present

## 2020-04-19 DIAGNOSIS — L4059 Other psoriatic arthropathy: Secondary | ICD-10-CM | POA: Diagnosis not present

## 2020-04-19 DIAGNOSIS — M48061 Spinal stenosis, lumbar region without neurogenic claudication: Secondary | ICD-10-CM | POA: Diagnosis not present

## 2020-04-19 DIAGNOSIS — G894 Chronic pain syndrome: Secondary | ICD-10-CM | POA: Diagnosis not present

## 2020-05-15 DIAGNOSIS — G894 Chronic pain syndrome: Secondary | ICD-10-CM | POA: Diagnosis not present

## 2020-05-15 DIAGNOSIS — M48061 Spinal stenosis, lumbar region without neurogenic claudication: Secondary | ICD-10-CM | POA: Diagnosis not present

## 2020-05-15 DIAGNOSIS — L4059 Other psoriatic arthropathy: Secondary | ICD-10-CM | POA: Diagnosis not present

## 2020-05-15 DIAGNOSIS — M47817 Spondylosis without myelopathy or radiculopathy, lumbosacral region: Secondary | ICD-10-CM | POA: Diagnosis not present

## 2020-05-21 DIAGNOSIS — Z23 Encounter for immunization: Secondary | ICD-10-CM | POA: Diagnosis not present

## 2020-05-27 DIAGNOSIS — M1712 Unilateral primary osteoarthritis, left knee: Secondary | ICD-10-CM | POA: Diagnosis not present

## 2020-06-12 DIAGNOSIS — M48061 Spinal stenosis, lumbar region without neurogenic claudication: Secondary | ICD-10-CM | POA: Diagnosis not present

## 2020-06-12 DIAGNOSIS — M47817 Spondylosis without myelopathy or radiculopathy, lumbosacral region: Secondary | ICD-10-CM | POA: Diagnosis not present

## 2020-06-12 DIAGNOSIS — Z23 Encounter for immunization: Secondary | ICD-10-CM | POA: Diagnosis not present

## 2020-06-12 DIAGNOSIS — L4059 Other psoriatic arthropathy: Secondary | ICD-10-CM | POA: Diagnosis not present

## 2020-06-12 DIAGNOSIS — G894 Chronic pain syndrome: Secondary | ICD-10-CM | POA: Diagnosis not present

## 2020-07-17 DIAGNOSIS — L4059 Other psoriatic arthropathy: Secondary | ICD-10-CM | POA: Diagnosis not present

## 2020-07-17 DIAGNOSIS — G894 Chronic pain syndrome: Secondary | ICD-10-CM | POA: Diagnosis not present

## 2020-07-17 DIAGNOSIS — M47817 Spondylosis without myelopathy or radiculopathy, lumbosacral region: Secondary | ICD-10-CM | POA: Diagnosis not present

## 2020-07-17 DIAGNOSIS — M48061 Spinal stenosis, lumbar region without neurogenic claudication: Secondary | ICD-10-CM | POA: Diagnosis not present

## 2020-07-31 DIAGNOSIS — M65331 Trigger finger, right middle finger: Secondary | ICD-10-CM | POA: Diagnosis not present

## 2020-08-15 DIAGNOSIS — L4059 Other psoriatic arthropathy: Secondary | ICD-10-CM | POA: Diagnosis not present

## 2020-08-15 DIAGNOSIS — G894 Chronic pain syndrome: Secondary | ICD-10-CM | POA: Diagnosis not present

## 2020-08-15 DIAGNOSIS — M48061 Spinal stenosis, lumbar region without neurogenic claudication: Secondary | ICD-10-CM | POA: Diagnosis not present

## 2020-08-15 DIAGNOSIS — M47817 Spondylosis without myelopathy or radiculopathy, lumbosacral region: Secondary | ICD-10-CM | POA: Diagnosis not present

## 2021-06-19 ENCOUNTER — Encounter (INDEPENDENT_AMBULATORY_CARE_PROVIDER_SITE_OTHER): Payer: Self-pay

## 2021-06-23 ENCOUNTER — Other Ambulatory Visit: Payer: Self-pay

## 2021-06-23 ENCOUNTER — Ambulatory Visit (INDEPENDENT_AMBULATORY_CARE_PROVIDER_SITE_OTHER): Payer: Medicare Other | Admitting: Ophthalmology

## 2021-06-23 ENCOUNTER — Encounter (INDEPENDENT_AMBULATORY_CARE_PROVIDER_SITE_OTHER): Payer: Self-pay | Admitting: Ophthalmology

## 2021-06-23 DIAGNOSIS — H35721 Serous detachment of retinal pigment epithelium, right eye: Secondary | ICD-10-CM | POA: Diagnosis not present

## 2021-06-23 DIAGNOSIS — H353211 Exudative age-related macular degeneration, right eye, with active choroidal neovascularization: Secondary | ICD-10-CM

## 2021-06-23 DIAGNOSIS — H2513 Age-related nuclear cataract, bilateral: Secondary | ICD-10-CM | POA: Diagnosis not present

## 2021-06-23 NOTE — Assessment & Plan Note (Signed)
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  Will need to commence with antivegF therapy OD and likely need pneumatic displacement of subretinal hemorrhage to diminish the risk of advanced subfoveal fibrotic scarring and loss of vision

## 2021-06-23 NOTE — Progress Notes (Addendum)
06/23/2021     CHIEF COMPLAINT Patient presents for  Chief Complaint  Patient presents with   Retina Evaluation      HISTORY OF PRESENT ILLNESS: Eric [place] is a 80 y.o. male who presents to the clinic today for:   HPI     Retina Evaluation   In both eyes.  This started 4 days ago.  Duration of 4 days.  Associated Symptoms Floaters.  Context:  distance vision, mid-range vision and near vision.        Comments   NP presents for Retinal Evaluation of Subretinal Hemorrhage OD, referred from Dr. Katy Fitch      Last edited by [name], COA on 06/23/2021  3:15 PM.      Referring physician: [name], [name],  [place] [address]  HISTORICAL INFORMATION:   Selected notes from the MEDICAL RECORD NUMBER       CURRENT MEDICATIONS: No current outpatient medications on file. (Ophthalmic Drugs)   No current facility-administered medications for this visit. (Ophthalmic Drugs)   Current Outpatient Medications (Other)  Medication Sig   ALPRAZolam (XANAX) 1 MG tablet TAKE 1 TABLET BY MOUTH THREE TIMES DAILY AS NEEDED   aspirin 325 MG tablet Take 325 mg by mouth daily.   cetirizine (ZYRTEC) 10 MG tablet Take 10 mg by mouth daily as needed. For allergies   chlorthalidone (HYGROTON) 25 MG tablet TAKE 1 TABLET (25 MG TOTAL) BY MOUTH DAILY.   doxycycline (VIBRAMYCIN) 100 MG capsule Take 1 capsule (100 mg total) by mouth 2 (two) times daily. One po bid x 7 days   fluticasone (FLONASE) 50 MCG/ACT nasal spray PLACE 2 SPRAYS INTO THE NOSE DAILY.   meloxicam (MOBIC) 15 MG tablet TAKE 1 TABLET (15 MG TOTAL) BY MOUTH DAILY.   OVER THE COUNTER MEDICATION Take 3 tablets by mouth daily. Instaflex   Potassium Chloride ER 20 MEQ TBCR TAKE 1 TABLET (20 MEQ TOTAL) BY MOUTH DAILY.   predniSONE (DELTASONE) 20 MG tablet Take 1 tablet (20 mg total) by mouth 2 (two) times daily with a meal.   sertraline (ZOLOFT) 100 MG tablet Take 100 mg by mouth daily.  1/2 tablet per day   sildenafil (VIAGRA) 100 MG tablet Take 100 mg by mouth daily as needed. For Erectile Dysfunction   simvastatin (ZOCOR) 20 MG tablet Take 1 tablet (20 mg total) by mouth at bedtime.   temazepam (RESTORIL) 15 MG capsule TAKE ONE OR TWO CAPSULES BY MOUTH AT BEDTIME AS NEEDED FOR SLEEP   VIAGRA 100 MG tablet TAKE 1 TABLET (100 MG TOTAL) BY MOUTH AS DIRECTED.   No current facility-administered medications for this visit. (Other)      REVIEW OF SYSTEMS: ROS   Negative for: Constitutional, Gastrointestinal, Neurological, Skin, Genitourinary, Musculoskeletal, HENT, Endocrine, Cardiovascular, Eyes, Respiratory, Psychiatric, Allergic/Imm, Heme/Lymph Last edited by Hurman Horn, MD on 06/23/2021  3:58 PM.       ALLERGIES Allergies  Allergen Reactions   Benadryl [Diphenhydramine Hcl]     Hyperactivity,  Difficulty breathing, difficulty swallowing.  Pt denies allergy.   Effexor [Venlafaxine Hydrochloride] Swelling    Of throat   Lariam [Mefloquine]     Suicidal ideation    PAST MEDICAL HISTORY Past Medical History:  Diagnosis Date   Allergic rhinitis    Anxiety    Dermatitis    Heart murmur    Hyperlipidemia    Hypertension    Leg pain, bilateral    Malaria  1970 to 1982   UTI (lower urinary tract infection)    Past Surgical History:  Procedure Laterality Date   COLONOSCOPY  12/2009   TONSILLECTOMY  1950    FAMILY HISTORY Family History  Problem Relation Age of Onset   Cancer Mother        breast   Cancer Brother        brain    Cancer Brother        renal cancer    SOCIAL HISTORY Social History   Tobacco Use   Smoking status: Former   Smokeless tobacco: Never  Substance Use Topics   Alcohol use: Yes    Alcohol/week: 24.0 standard drinks    Types: 10 Glasses of wine, 14 Shots of liquor per week   Drug use: No         OPHTHALMIC EXAM:  Base Eye Exam     Visual Acuity (ETDRS)       Right Left   Dist [place] 20/80 -2 20/30 -2   Dist  ph [place] 20/60 20/25         Tonometry (Tonopen, 3:23 PM)       Right Left   Pressure 6 8         Pupils       Pupils Dark Light Shape React APD   Right PERRL 5 4 Round Brisk None   Left PERRL 5 4 Round Brisk None         Visual Fields       Left Right    Full    Restrictions  Partial inner superior nasal deficiency         Extraocular Movement       Right Left    Full, Ortho XT, Ortho         Neuro/Psych     Oriented x3: Yes   Mood/Affect: Normal         Dilation     Both eyes: 1.0% Mydriacyl, 2.5% Phenylephrine @ 3:23 PM           Slit Lamp and Fundus Exam     External Exam       Right Left   External Normal Normal         Slit Lamp Exam       Right Left   Lids/Lashes Normal Normal   Conjunctiva/Sclera White and quiet White and quiet   Cornea Clear Clear   Anterior Chamber Deep and quiet Deep and quiet   Iris Round and reactive Round and reactive   Lens Clear Clear   Anterior Vitreous Normal Normal         Fundus Exam       Right Left   Posterior Vitreous Posterior vitreous detachment Posterior vitreous detachment   Disc Normal Normal   C/D Ratio 0.35 0.4   Macula In the subfoveal location, sub-RPE hemorrhage with adjacent to this region breakthrough subretinal hemorrhage nodular approximately  9-10 disc areas in size Normal   Vessels Normal Normal   Periphery Normal Normal            IMAGING AND PROCEDURES  Imaging and Procedures for 06/30/21  OCT, Retina - OU - Both Eyes       Right Eye Quality was good. Scan locations included subfoveal. Central Foveal Thickness: 310. Progression has no prior data. Findings include abnormal foveal contour, choroidal neovascular membrane.   Left Eye Quality was good. Scan locations included subfoveal. Central Foveal Thickness: 301. Progression has no  prior data. Findings include retinal drusen , abnormal foveal contour.   Notes Subretinal hemorrhage temporal to the FAZ  region, with a large hemorrhagic sub-RPE elevation in a subfoveal location.  OD  OS drusen no sign of CNVM     Color Fundus Photography Optos - OU - Both Eyes       Right Eye Progression has no prior data. Disc findings include normal observations. Macula : hemorrhage. Vessels : normal observations. Periphery : normal observations.   Left Eye Progression has no prior data. Disc findings include normal observations.   Notes Incidental posterior vitreous detachment OU.  Sub-RPE and subretinal hemorrhage subfoveal location OD     Intravitreal Injection, Pharmacologic Agent - OD - Right Eye       Time Out 06/23/2021. 9:00 AM. Confirmed correct patient, procedure, site, and patient consented.   Anesthesia Topical anesthesia was used. Anesthetic medications included Lidocaine 4%.   Procedure Preparation included 5% betadine to ocular surface, 10% betadine to eyelids, Tobramycin 0.3%. A 30 gauge needle was used.   Injection: 2.5 mg bevacizumab 2.5 MG/0.1ML   Route: Intravitreal, Site: Right Eye   NDC: [phone], Lot: [id number]   Post-op Post injection exam found visual acuity of at least counting fingers. The patient tolerated the procedure well. There were no complications. The patient received written and verbal post procedure care education. Post injection medications were not given.              ASSESSMENT/PLAN:  Nuclear sclerotic cataract of both eyes Advanced nuclear sclerotic changes in each eye accounting for some moderate Vitreous opacification visualization posteriorly OU.  I do recommend consideration of cataract extraction with intraocular lens placement OS and OD in order to maximize visualization of the posterior pole as well as improve his visual acuity, which can proceed at any time  Exudative age-related macular degeneration of right eye with active choroidal neovascularization (HCC) OD with wet AMD signified by subretinal pigment epithelial  hemorrhage and extension of subretinal hemorrhage inferotemporally.  Will need to commence with antivegF therapy OD and likely need pneumatic displacement of subretinal hemorrhage to diminish the risk of advanced subfoveal fibrotic scarring and loss of vision  Retinal pigment epithelial detachment, right With subfoveal location of the RPE detachment hemorrhagic though it is, precludes vitrectomy and removal of subretinal hemorrhage or injection tPA.     ICD-10-CM   1. Exudative age-related macular degeneration of right eye with active choroidal neovascularization (HCC)  H35.3211 OCT, Retina - OU - Both Eyes    Color Fundus Photography Optos - OU - Both Eyes    Intravitreal Injection, Pharmacologic Agent - OD - Right Eye    bevacizumab (AVASTIN) SOSY 2.5 mg    2. Retinal pigment epithelial detachment, right  H35.721 OCT, Retina - OU - Both Eyes    Color Fundus Photography Optos - OU - Both Eyes    3. Nuclear sclerotic cataract of both eyes  H25.13       1.  OD wet AMD with hemorrhagic CNVM with subfoveal RPE detachment which accounts for the relatively good visual acuity despite the nodular appearance and extensive subretinal hemorrhage breakthrough temporally in the right eye.  We will deliver antivegF therapy now in order to prevent progression of the CNVM component and then follow-up in 2 days for pneumatic displacement via injection intravitreal SF6 100% 0.2 cc with daytime positioning to pneumatically displace the hemorrhage  2.  OU will need cataract surgery intraocular lens placement follow-up with Dr. Wyatt Portela  3.  On follow-up today 06/30/2021, documentation was discovered to be incomplete on date of evaluation 06/23/2021 on the date of which the patient was found to have wet AMD, subfoveal CNVM OD and intravitreal Avastin was ordered scheduled and documented to have been delivered  Note was a addended on 06-30-2021 to reflect delivery of intravitreal Avastin delivered on that  date  Ophthalmic Meds Ordered this visit:  Meds ordered this encounter  Medications   bevacizumab (AVASTIN) SOSY 2.5 mg       Return in about 2 days (around 06/25/2021) for dilate, OD, OCT,, injection of Vit sub-SF6, pneumatic displacement.  There are no Patient Instructions on file for this visit.   Explained the diagnoses, plan, and follow up with the patient and they expressed understanding.  Patient expressed understanding of the importance of proper follow up care.   Clent Demark Kimberlee Shoun M.D. Diseases & Surgery of the Retina and Vitreous Retina & Diabetic [place] 06/30/21     Abbreviations: M myopia (nearsighted); A astigmatism; H hyperopia (farsighted); P presbyopia; Mrx spectacle prescription;  CTL contact lenses; OD right eye; OS left eye; OU both eyes  XT exotropia; ET esotropia; PEK punctate epithelial keratitis; PEE punctate epithelial erosions; DES dry eye syndrome; MGD meibomian gland dysfunction; ATs artificial tears; PFAT's preservative free artificial tears; [place] nuclear sclerotic cataract; PSC posterior subcapsular cataract; ERM epi-retinal membrane; PVD posterior vitreous detachment; RD retinal detachment; DM diabetes mellitus; DR diabetic retinopathy; NPDR non-proliferative diabetic retinopathy; PDR proliferative diabetic retinopathy; CSME clinically significant macular edema; DME diabetic macular edema; dbh dot blot hemorrhages; CWS cotton wool spot; POAG primary open angle glaucoma; C/D cup-to-disc ratio; HVF humphrey visual field; GVF goldmann visual field; OCT optical coherence tomography; IOP intraocular pressure; BRVO Branch retinal vein occlusion; CRVO central retinal vein occlusion; CRAO central retinal artery occlusion; BRAO branch retinal artery occlusion; RT retinal tear; SB scleral buckle; PPV pars plana vitrectomy; VH Vitreous hemorrhage; PRP panretinal laser photocoagulation; IVK intravitreal kenalog; VMT vitreomacular traction; MH Macular hole;  NVD  neovascularization of the disc; NVE neovascularization elsewhere; AREDS age related eye disease study; ARMD age related macular degeneration; POAG primary open angle glaucoma; EBMD epithelial/anterior basement membrane dystrophy; ACIOL anterior chamber intraocular lens; IOL intraocular lens; PCIOL posterior chamber intraocular lens; Phaco/IOL phacoemulsification with intraocular lens placement; [place] photorefractive keratectomy; LASIK laser assisted in situ keratomileusis; HTN hypertension; DM diabetes mellitus; COPD chronic obstructive pulmonary disease

## 2021-06-23 NOTE — Assessment & Plan Note (Signed)
With subfoveal location of the RPE detachment hemorrhagic though it is, precludes vitrectomy and removal of subretinal hemorrhage or injection tPA.

## 2021-06-23 NOTE — Assessment & Plan Note (Addendum)
Advanced nuclear sclerotic changes in each eye accounting for some moderate Vitreous opacification visualization posteriorly OU.  I do recommend consideration of cataract extraction with intraocular lens placement OS and OD in order to maximize visualization of the posterior pole as well as improve his visual acuity, which can proceed at any time

## 2021-06-25 ENCOUNTER — Ambulatory Visit (INDEPENDENT_AMBULATORY_CARE_PROVIDER_SITE_OTHER): Payer: Medicare Other | Admitting: Ophthalmology

## 2021-06-25 ENCOUNTER — Other Ambulatory Visit: Payer: Self-pay

## 2021-06-25 ENCOUNTER — Encounter (INDEPENDENT_AMBULATORY_CARE_PROVIDER_SITE_OTHER): Payer: Self-pay | Admitting: Ophthalmology

## 2021-06-25 DIAGNOSIS — H353211 Exudative age-related macular degeneration, right eye, with active choroidal neovascularization: Secondary | ICD-10-CM | POA: Diagnosis not present

## 2021-06-25 DIAGNOSIS — H3561 Retinal hemorrhage, right eye: Secondary | ICD-10-CM | POA: Insufficient documentation

## 2021-06-25 NOTE — Patient Instructions (Signed)
Postoperative positioning was reviewed with the patient to use most of his waking hours looking down as reading a book in his lap so that the bubble which will he will notices a large floater will cover the center of the vision.  He may take breaks for routine activities of living including eating bathroom breaks showers and such and sleep on either side at night otherwise face looking straight down as much as possible for the next 3 days

## 2021-06-25 NOTE — Assessment & Plan Note (Signed)
OD post recent injection into vegF, Avastin for CNVM, will deliver pneumatic displacement of subretinal hemorrhage today as drainage of sub-RPE hemorrhage is not possible surgically.  Postoperative positioning was reviewed with the patient to use most of his waking hours looking down as reading a book in his lap so that the bubble which will he will notices a large floater will cover the center of the vision.  He may take breaks for routine activities of living including eating bathroom breaks showers and such and sleep on either side at night otherwise face looking straight down as much as possible for the next 3 days

## 2021-06-25 NOTE — Progress Notes (Addendum)
06/25/2021     CHIEF COMPLAINT Patient presents for  Chief Complaint  Patient presents with   Retina Follow Up      HISTORY OF PRESENT ILLNESS: Eric [place] is a 80 y.o. 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  This started 2 days ago.  Severity is mild.  Duration of 2 days.  Since onset it is stable.        Comments   2 day fu OD oct, post injection Avastin, for possible injection vit sub sf6 pneumatic displacement. Patient states vision is stable and unchanged since last visit. Denies any new floaters or FOL. Pt states "all the same, the shadow is there still." Pt states "Dr Zadie Rhine mentioned cataract surgery, I did not pick up what he was saying but I want more information on cataract surgery."       Last edited by Hurman Horn, MD on 06/25/2021 10:26 AM.      Referring physician: Warden Fillers, MD [address] [place],  [place] [id number]  HISTORICAL INFORMATION:   Selected notes from the [facility]: No current outpatient medications on file. (Ophthalmic Drugs)   No current facility-administered medications for this visit. (Ophthalmic Drugs)   Current Outpatient Medications (Other)  Medication Sig   ALPRAZolam (XANAX) 1 MG tablet TAKE 1 TABLET BY MOUTH THREE TIMES DAILY AS NEEDED   aspirin 325 MG tablet Take 325 mg by mouth daily.   cetirizine (ZYRTEC) 10 MG tablet Take 10 mg by mouth daily as needed. For allergies   chlorthalidone (HYGROTON) 25 MG tablet TAKE 1 TABLET (25 MG TOTAL) BY MOUTH DAILY.   doxycycline (VIBRAMYCIN) 100 MG capsule Take 1 capsule (100 mg total) by mouth 2 (two) times daily. One po bid x 7 days   fluticasone (FLONASE) 50 MCG/ACT nasal spray PLACE 2 SPRAYS INTO THE NOSE DAILY.   meloxicam (MOBIC) 15 MG tablet TAKE 1 TABLET (15 MG TOTAL) BY MOUTH DAILY.   OVER THE COUNTER MEDICATION Take 3 tablets by  mouth daily. Instaflex   Potassium Chloride ER 20 MEQ TBCR TAKE 1 TABLET (20 MEQ TOTAL) BY MOUTH DAILY.   predniSONE (DELTASONE) 20 MG tablet Take 1 tablet (20 mg total) by mouth 2 (two) times daily with a meal.   sertraline (ZOLOFT) 100 MG tablet Take 100 mg by mouth daily. 1/2 tablet per day   sildenafil (VIAGRA) 100 MG tablet Take 100 mg by mouth daily as needed. For Erectile Dysfunction   simvastatin (ZOCOR) 20 MG tablet Take 1 tablet (20 mg total) by mouth at bedtime.   temazepam (RESTORIL) 15 MG capsule TAKE ONE OR TWO CAPSULES BY MOUTH AT BEDTIME AS NEEDED FOR SLEEP   VIAGRA 100 MG tablet TAKE 1 TABLET (100 MG TOTAL) BY MOUTH AS DIRECTED.   No current facility-administered medications for this visit. (Other)      REVIEW OF SYSTEMS:    ALLERGIES Allergies  Allergen Reactions   Benadryl [Diphenhydramine Hcl]     Hyperactivity,  Difficulty breathing, difficulty swallowing.  Pt denies allergy.   Effexor [Venlafaxine Hydrochloride] Swelling    Of throat   Lariam [Mefloquine]     Suicidal ideation    PAST MEDICAL HISTORY Past Medical History:  Diagnosis Date   Allergic rhinitis    Anxiety  Dermatitis    Heart murmur    Hyperlipidemia    Hypertension    Leg pain, bilateral    Malaria 1970 to 1982   UTI (lower urinary tract infection)    Past Surgical History:  Procedure Laterality Date   COLONOSCOPY  12/2009   TONSILLECTOMY  1950    FAMILY HISTORY Family History  Problem Relation Age of Onset   Cancer Mother        breast   Cancer Brother        brain    Cancer Brother        renal cancer    SOCIAL HISTORY Social History   Tobacco Use   Smoking status: Former   Smokeless tobacco: Never  Substance Use Topics   Alcohol use: Yes    Alcohol/week: 24.0 standard drinks    Types: 10 Glasses of wine, 14 Shots of liquor per week   Drug use: No         OPHTHALMIC EXAM:  Base Eye Exam     Visual Acuity (ETDRS)       Right Left   Dist [place] 20/80 -1  20/40 -2   Dist ph [place] 20/25 -1         Tonometry (Tonopen, 9:37 AM)       Right Left   Pressure 8 9         Pupils       Pupils Dark Light APD   Right PERRL 5 4 None   Left PERRL 5 4 None         Extraocular Movement       Right Left    Full Full         Neuro/Psych     Oriented x3: Yes   Mood/Affect: Normal         Dilation     Right eye: 1.0% Mydriacyl, 2.5% Phenylephrine @ 9:37 AM           Slit Lamp and Fundus Exam     External Exam       Right Left   External Normal Normal         Slit Lamp Exam       Right Left   Lids/Lashes Normal Normal   Conjunctiva/Sclera White and quiet White and quiet   Cornea Clear Clear   Anterior Chamber Deep and quiet Deep and quiet   Iris Round and reactive Round and reactive   Lens 2+ Nuclear sclerosis 2+ Nuclear sclerosis   Anterior Vitreous Normal Normal         Fundus Exam       Right Left   Posterior Vitreous Posterior vitreous detachment    Disc Normal    C/D Ratio 0.35    Macula In the subfoveal location, sub-RPE hemorrhage with adjacent to this region breakthrough subretinal hemorrhage nodular approximately  9-10 disc areas in size    Vessels Normal    Periphery Normal             IMAGING AND PROCEDURES  Imaging and Procedures for 06/25/21  Intravitreal Injection Substitute Fluid Gas Exchange - OD - Right Eye       Time Out 06/25/2021. 10:30 AM. Confirmed correct patient, procedure, site, and patient consented.   Anesthesia Topical anesthesia was used. Anesthetic medications included Lidocaine 4%.   Procedure Preparation included 5% betadine to ocular surface, 10% betadine to eyelids, Tobramycin 0.3%. A 30 gauge needle was used. Gas injected- SF6 gas.  Post-op Post injection exam found visual acuity of at least counting fingers. The patient tolerated the procedure well. There were no complications. The patient received written and verbal post procedure care education.    Notes SF6 100%, 0.1 cc injected via inferotemporal quadrant , Followed thereafter by paracentesis also under sterile conditions, 30-gauge needle and 0.1 cc removed              ASSESSMENT/PLAN:  Retinal hemorrhage noted on examination of right eye OD post recent injection into vegF, Avastin for CNVM, will deliver pneumatic displacement of subretinal hemorrhage today as drainage of sub-RPE hemorrhage is not possible surgically.  Postoperative positioning was reviewed with the patient to use most of his waking hours looking down as reading a book in his lap so that the bubble which will he will notices a large floater will cover the center of the vision.  He may take breaks for routine activities of living including eating bathroom breaks showers and such and sleep on either side at night otherwise face looking straight down as much as possible for the next 3 days      ICD-10-CM   1. Retinal hemorrhage noted on examination of right eye  H35.61 Intravitreal Injection Substitute Fluid Gas Exchange - OD - Right Eye    2. Exudative age-related macular degeneration of right eye with active choroidal neovascularization ([place])  H35.3211 CANCELED: OCT, Retina - OU - Both Eyes      1.  OD today 2 days post injection Avastin for wet AMD with sub-RPE and subretinal hemorrhage in the macula.  Plan is for injection of vitreous substitute for pneumatic displacement of vitreous hemorrhage OD  2.  Pneumatic displacement carried out today uneventfully and confirmed approximately 0.1 cc of SF6 injected in sterile conditions  3.  Positioning reviewed.  Green bracelet identifying gas injection placed right arm  Ophthalmic Meds Ordered this visit:  No orders of the defined types were placed in this encounter.      Return in about 5 days (around 06/30/2021) for dilate, OD, COLOR FP, OCT.  Patient Instructions  Postoperative positioning was reviewed with the patient to use most of his waking  hours looking down as reading a book in his lap so that the bubble which will he will notices a large floater will cover the center of the vision.  He may take breaks for routine activities of living including eating bathroom breaks showers and such and sleep on either side at night otherwise face looking straight down as much as possible for the next 3 days   Explained the diagnoses, plan, and follow up with the patient and they expressed understanding.  Patient expressed understanding of the importance of proper follow up care.   Clent Demark Athen Riel M.D. Diseases & Surgery of the Retina and Vitreous Retina & Diabetic [place] 06/25/21     Abbreviations: M myopia (nearsighted); A astigmatism; H hyperopia (farsighted); P presbyopia; Mrx spectacle prescription;  CTL contact lenses; OD right eye; OS left eye; OU both eyes  XT exotropia; ET esotropia; PEK punctate epithelial keratitis; PEE punctate epithelial erosions; DES dry eye syndrome; MGD meibomian gland dysfunction; ATs artificial tears; PFAT's preservative free artificial tears; [place] nuclear sclerotic cataract; PSC posterior subcapsular cataract; ERM epi-retinal membrane; PVD posterior vitreous detachment; RD retinal detachment; DM diabetes mellitus; DR diabetic retinopathy; NPDR non-proliferative diabetic retinopathy; PDR proliferative diabetic retinopathy; CSME clinically significant macular edema; DME diabetic macular edema; dbh dot blot hemorrhages; CWS cotton wool spot; POAG primary open  angle glaucoma; C/D cup-to-disc ratio; HVF humphrey visual field; GVF goldmann visual field; OCT optical coherence tomography; IOP intraocular pressure; BRVO Branch retinal vein occlusion; CRVO central retinal vein occlusion; CRAO central retinal artery occlusion; BRAO branch retinal artery occlusion; RT retinal tear; SB scleral buckle; PPV pars plana vitrectomy; VH Vitreous hemorrhage; PRP panretinal laser photocoagulation; IVK intravitreal kenalog; VMT  vitreomacular traction; MH Macular hole;  NVD neovascularization of the disc; NVE neovascularization elsewhere; AREDS age related eye disease study; ARMD age related macular degeneration; POAG primary open angle glaucoma; EBMD epithelial/anterior basement membrane dystrophy; ACIOL anterior chamber intraocular lens; IOL intraocular lens; PCIOL posterior chamber intraocular lens; Phaco/IOL phacoemulsification with intraocular lens placement; [place] photorefractive keratectomy; LASIK laser assisted in situ keratomileusis; HTN hypertension; DM diabetes mellitus; COPD chronic obstructive pulmonary disease

## 2021-06-30 ENCOUNTER — Ambulatory Visit (INDEPENDENT_AMBULATORY_CARE_PROVIDER_SITE_OTHER): Payer: Medicare Other | Admitting: Ophthalmology

## 2021-06-30 ENCOUNTER — Other Ambulatory Visit: Payer: Self-pay

## 2021-06-30 ENCOUNTER — Encounter (INDEPENDENT_AMBULATORY_CARE_PROVIDER_SITE_OTHER): Payer: Self-pay | Admitting: Ophthalmology

## 2021-06-30 DIAGNOSIS — H353211 Exudative age-related macular degeneration, right eye, with active choroidal neovascularization: Secondary | ICD-10-CM

## 2021-06-30 DIAGNOSIS — H3561 Retinal hemorrhage, right eye: Secondary | ICD-10-CM

## 2021-06-30 MED ORDER — BEVACIZUMAB 2.5 MG/0.1ML IZ SOSY
2.5000 mg | PREFILLED_SYRINGE | INTRAVITREAL | Status: AC | PRN
  Administered 2021-06-30: 2.5 mg via INTRAVITREAL

## 2021-06-30 NOTE — Addendum Note (Signed)
Addended by: Deloria Lair A on: 06/30/2021 09:49 AM   Modules accepted: Orders

## 2021-06-30 NOTE — Assessment & Plan Note (Signed)
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

## 2021-06-30 NOTE — Progress Notes (Signed)
06/30/2021     CHIEF COMPLAINT Patient presents for  Chief Complaint  Patient presents with   Retina Follow Up      HISTORY OF PRESENT ILLNESS: Eric Hoffman is a 80 y.o. 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  This started 5 days ago.  Severity is mild.  Duration of 5 days.  Since onset it is stable.        Comments   5 day fu OD oct fp. Patient states "since injection, the cloud started to disappear very slowly. The last spec of the injection material was like a fly flying around, but that has gone away as of yesterday. The left eye is okay, but I still notice a smaller cloud higher up in my right eye. It was in the center of my eye before. It feels more comfortable driving now. Not a good experience with oncoming headlights."  Denies any new FOL, floater post injection patient states has gone away.       Last edited by Laurin Coder on 06/30/2021  8:54 AM.      Referring physician: [name], [place],  [place] [id number]  HISTORICAL INFORMATION:   Selected notes from the MEDICAL RECORD NUMBER       CURRENT MEDICATIONS: No current outpatient medications on file. (Ophthalmic Drugs)   No current facility-administered medications for this visit. (Ophthalmic Drugs)   Current Outpatient Medications (Other)  Medication Sig   ALPRAZolam (XANAX) 1 MG tablet TAKE 1 TABLET BY MOUTH THREE TIMES DAILY AS NEEDED   aspirin 325 MG tablet Take 325 mg by mouth daily.   cetirizine (ZYRTEC) 10 MG tablet Take 10 mg by mouth daily as needed. For allergies   chlorthalidone (HYGROTON) 25 MG tablet TAKE 1 TABLET (25 MG TOTAL) BY MOUTH DAILY.   doxycycline (VIBRAMYCIN) 100 MG capsule Take 1 capsule (100 mg total) by mouth 2 (two) times daily. One po bid x 7 days   fluticasone (FLONASE) 50 MCG/ACT nasal spray PLACE 2 SPRAYS INTO THE NOSE DAILY.   meloxicam  (MOBIC) 15 MG tablet TAKE 1 TABLET (15 MG TOTAL) BY MOUTH DAILY.   OVER THE COUNTER MEDICATION Take 3 tablets by mouth daily. Instaflex   Potassium Chloride ER 20 MEQ TBCR TAKE 1 TABLET (20 MEQ TOTAL) BY MOUTH DAILY.   predniSONE (DELTASONE) 20 MG tablet Take 1 tablet (20 mg total) by mouth 2 (two) times daily with a meal.   sertraline (ZOLOFT) 100 MG tablet Take 100 mg by mouth daily. 1/2 tablet per day   sildenafil (VIAGRA) 100 MG tablet Take 100 mg by mouth daily as needed. For Erectile Dysfunction   simvastatin (ZOCOR) 20 MG tablet Take 1 tablet (20 mg total) by mouth at bedtime.   temazepam (RESTORIL) 15 MG capsule TAKE ONE OR TWO CAPSULES BY MOUTH AT BEDTIME AS NEEDED FOR SLEEP   VIAGRA 100 MG tablet TAKE 1 TABLET (100 MG TOTAL) BY MOUTH AS DIRECTED.   No current facility-administered medications for this visit. (Other)      REVIEW OF SYSTEMS:    ALLERGIES Allergies  Allergen Reactions   Benadryl [Diphenhydramine Hcl]     Hyperactivity,  Difficulty breathing, difficulty swallowing.  Pt denies allergy.   Effexor [Venlafaxine Hydrochloride] Swelling    Of throat   Lariam [Mefloquine]  Suicidal ideation    PAST MEDICAL HISTORY Past Medical History:  Diagnosis Date   Allergic rhinitis    Anxiety    Dermatitis    Heart murmur    Hyperlipidemia    Hypertension    Leg pain, bilateral    Malaria 1970 to 1982   UTI (lower urinary tract infection)    Past Surgical History:  Procedure Laterality Date   COLONOSCOPY  12/2009   TONSILLECTOMY  1950    FAMILY HISTORY Family History  Problem Relation Age of Onset   Cancer Mother        breast   Cancer Brother        brain    Cancer Brother        renal cancer    SOCIAL HISTORY Social History   Tobacco Use   Smoking status: Former   Smokeless tobacco: Never  Substance Use Topics   Alcohol use: Yes    Alcohol/week: 24.0 standard drinks    Types: 10 Glasses of wine, 14 Shots of liquor per week   Drug use: No          OPHTHALMIC EXAM:  Base Eye Exam     Visual Acuity (ETDRS)       Right Left   Dist [place] 20/80 20/30 -1   Dist ph [place] NI          Tonometry (Tonopen, 8:58 AM)       Right Left   Pressure 10 10         Pupils       Pupils Dark Light Shape React APD   Right PERRL 5 4 Round Brisk None   Left PERRL 5 4 Round Brisk None         Extraocular Movement       Right Left    Full Full         Neuro/Psych     Oriented x3: Yes   Mood/Affect: Normal         Dilation     Right eye: 1.0% Mydriacyl, 2.5% Phenylephrine @ 8:57 AM           Slit Lamp and Fundus Exam     External Exam       Right Left   External Normal Normal         Slit Lamp Exam       Right Left   Lids/Lashes Normal Normal   Conjunctiva/Sclera White and quiet White and quiet   Cornea Clear Clear   Anterior Chamber Deep and quiet Deep and quiet   Iris Round and reactive Round and reactive   Lens 2+ Nuclear sclerosis 2+ Nuclear sclerosis   Anterior Vitreous Normal Normal         Fundus Exam       Right Left   Posterior Vitreous Posterior vitreous detachment, no gas remains in the eye    Disc Normal    C/D Ratio 0.35    Macula In the subfoveal location, sub-RPE hemorrhage with adjacent to this region breakthrough subretinal hemorrhage nodular approximately  9-10 disc areas in size somewhat less nodular    Vessels Normal    Periphery Normal             IMAGING AND PROCEDURES  Imaging and Procedures for 06/30/21  OCT, Retina - OU - Both Eyes       Right Eye Quality was good. Scan locations included subfoveal. Central Foveal Thickness: 310. Progression has improved. Findings include abnormal  foveal contour, choroidal neovascular membrane.   Left Eye Quality was good. Scan locations included subfoveal. Central Foveal Thickness: 292. Progression has been stable. Findings include retinal drusen , abnormal foveal contour.   Notes Subretinal hemorrhage temporal to  the FAZ region, with a large hemorrhagic sub-RPE elevation in a subfoveal location.  OD  OS drusen no sign of CNVM     Color Fundus Photography Optos - OU - Both Eyes       Right Eye Progression has no prior data. Disc findings include normal observations. Macula : hemorrhage, retinal pigment epithelium abnormalities. Vessels : normal observations. Periphery : normal observations.   Left Eye Progression has no prior data. Disc findings include normal observations. Macula : normal observations. Vessels : normal observations. Periphery : normal observations.   Notes Incidental posterior vitreous detachment OU.  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 Exudative age-related macular degeneration of right eye with active choroidal neovascularization (HCC)  H35.3211 OCT, Retina - OU - Both Eyes    Color Fundus Photography Optos - OU - Both Eyes    2. Retinal hemorrhage noted on examination of right eye  H35.61       1.  Overall visual acuity remains stable objectively yet by subjective visualization by patient he seems to have slight improvement in acuity.  We will continue to monitor and look for signs of further resolution.  He understands that hemorrhage must be spontaneously cleared by his body but the causative blood vessels growing, CNVM, so-called wet AMD, still needs to be controlled here after on monthly injections of antivegF, intravitreal Avastin  OD  2.  3.  Ophthalmic Meds Ordered this visit:  No orders of the defined types were placed in this encounter.      Return in about 3 weeks (around 07/21/2021) for dilate, AVASTIN OCT, OD.  There are no Patient Instructions on file for this visit.   Explained the diagnoses, plan, and follow up with the patient and they expressed understanding.  Patient expressed understanding of the importance of proper follow up care.   Clent Demark Nicoli Nardozzi M.D. Diseases & Surgery of the Retina and Vitreous Retina [facility]abetic [place] 06/30/21     Abbreviations: M myopia (nearsighted); A astigmatism; H hyperopia (farsighted); P presbyopia; Mrx spectacle prescription;  CTL contact lenses; OD right eye; OS left eye; OU both eyes  XT exotropia; ET esotropia; PEK punctate epithelial keratitis; PEE punctate epithelial erosions; DES dry eye syndrome; MGD meibomian gland dysfunction; ATs artificial tears; PFAT's preservative free artificial tears; [place] nuclear sclerotic cataract; PSC posterior subcapsular cataract; ERM epi-retinal membrane; PVD posterior vitreous detachment; RD retinal detachment; DM diabetes mellitus; DR diabetic retinopathy; NPDR non-proliferative diabetic retinopathy; PDR proliferative diabetic retinopathy; CSME clinically significant macular edema; DME diabetic macular edema; dbh dot blot hemorrhages; CWS cotton wool spot; POAG primary open angle glaucoma; C/D cup-to-disc ratio; HVF humphrey visual field; GVF goldmann visual field; OCT optical coherence tomography; IOP intraocular pressure; BRVO Branch retinal vein occlusion; CRVO  central retinal vein occlusion; CRAO central retinal artery occlusion; BRAO branch retinal artery occlusion; RT retinal tear; SB scleral buckle; PPV pars plana vitrectomy; VH Vitreous hemorrhage; PRP panretinal laser photocoagulation; IVK intravitreal kenalog; VMT vitreomacular traction; MH Macular hole;  NVD neovascularization of the disc; NVE neovascularization  elsewhere; AREDS age related eye disease study; ARMD age related macular degeneration; POAG primary open angle glaucoma; EBMD epithelial/anterior basement membrane dystrophy; ACIOL anterior chamber intraocular lens; IOL intraocular lens; PCIOL posterior chamber intraocular lens; Phaco/IOL phacoemulsification with intraocular lens placement; [place] photorefractive keratectomy; LASIK laser assisted in situ keratomileusis; HTN hypertension; DM diabetes mellitus; COPD chronic obstructive pulmonary disease

## 2021-06-30 NOTE — Assessment & Plan Note (Signed)
Subretinal hemorrhage as component of subfoveal wet AMD OD.  Onset at 06/23/2021, we will follow-up at 28 to 30 days

## 2021-07-22 ENCOUNTER — Encounter (INDEPENDENT_AMBULATORY_CARE_PROVIDER_SITE_OTHER): Payer: Medicare Other | Admitting: Ophthalmology

## 2021-09-23 ENCOUNTER — Encounter (INDEPENDENT_AMBULATORY_CARE_PROVIDER_SITE_OTHER): Payer: Medicare Other | Admitting: Ophthalmology

## 2021-09-25 ENCOUNTER — Ambulatory Visit (INDEPENDENT_AMBULATORY_CARE_PROVIDER_SITE_OTHER): Payer: Medicare Other | Admitting: Ophthalmology

## 2021-09-25 ENCOUNTER — Encounter (INDEPENDENT_AMBULATORY_CARE_PROVIDER_SITE_OTHER): Payer: Self-pay | Admitting: Ophthalmology

## 2021-09-25 ENCOUNTER — Other Ambulatory Visit: Payer: Self-pay

## 2021-09-25 DIAGNOSIS — H353211 Exudative age-related macular degeneration, right eye, with active choroidal neovascularization: Secondary | ICD-10-CM | POA: Diagnosis not present

## 2021-09-25 DIAGNOSIS — H35721 Serous detachment of retinal pigment epithelium, right eye: Secondary | ICD-10-CM

## 2021-09-25 MED ORDER — BEVACIZUMAB 2.5 MG/0.1ML IZ SOSY
2.5000 mg | PREFILLED_SYRINGE | INTRAVITREAL | Status: AC | PRN
  Administered 2021-09-25: 2.5 mg via INTRAVITREAL

## 2021-09-25 NOTE — Progress Notes (Signed)
09/25/2021     CHIEF COMPLAINT Patient presents for  Chief Complaint  Patient presents with   Retina Follow Up      HISTORY OF PRESENT ILLNESS: Eric Hoffman is a 81 y.o. male who presents to the clinic today for:   HPI     Retina Follow Up           Diagnosis: Wet AMD   Laterality: both eyes   Onset: 12 weeks ago   Severity: mild   Duration: 12 weeks   Course: stable         Comments   12 week fu Dilate OD and Avastin OD and OCT  Pt states VA OU stable since last visit. Pt denies FOL, floaters, or ocular pain OU.  Pt states, "I do not think my vision has gotten any worse but it sure is not any better. The cloud in my right eye does seem a little less.:"  By Snellen acuity testing ET DRS charts standard illuminatance, patient has improved acuity today.  Unable to make it back for regularly scheduled follow-up visit some 2 and half months previous due to family illness        Last edited by Hurman Horn, MD on 09/25/2021 10:03 AM.      Referring physician: [name], [place] [place],  [place] [address]  HISTORICAL INFORMATION:   Selected notes from the [place]: No current outpatient medications on file. (Ophthalmic Drugs)   No current facility-administered medications for this visit. (Ophthalmic Drugs)   Current Outpatient Medications (Other)  Medication Sig   ALPRAZolam (XANAX) 1 MG tablet TAKE 1 TABLET BY MOUTH THREE TIMES DAILY AS NEEDED   aspirin 325 MG tablet Take 325 mg by mouth daily.   cetirizine (ZYRTEC) 10 MG tablet Take 10 mg by mouth daily as needed. For allergies   chlorthalidone (HYGROTON) 25 MG tablet TAKE 1 TABLET (25 MG TOTAL) BY MOUTH DAILY.   doxycycline (VIBRAMYCIN) 100 MG capsule Take 1 capsule (100 mg total) by mouth 2 (two) times daily. One po bid x 7 days   fluticasone (FLONASE) 50 MCG/ACT nasal spray PLACE 2 SPRAYS INTO THE NOSE DAILY.   meloxicam  (MOBIC) 15 MG tablet TAKE 1 TABLET (15 MG TOTAL) BY MOUTH DAILY.   OVER THE COUNTER MEDICATION Take 3 tablets by mouth daily. Instaflex   Potassium Chloride ER 20 MEQ TBCR TAKE 1 TABLET (20 MEQ TOTAL) BY MOUTH DAILY.   predniSONE (DELTASONE) 20 MG tablet Take 1 tablet (20 mg total) by mouth 2 (two) times daily with a meal.   sertraline (ZOLOFT) 100 MG tablet Take 100 mg by mouth daily. 1/2 tablet per day   sildenafil (VIAGRA) 100 MG tablet Take 100 mg by mouth daily as needed. For Erectile Dysfunction   simvastatin (ZOCOR) 20 MG tablet Take 1 tablet (20 mg total) by mouth at bedtime.   temazepam (RESTORIL) 15 MG capsule TAKE ONE OR TWO CAPSULES BY MOUTH AT BEDTIME AS NEEDED FOR SLEEP   VIAGRA 100 MG tablet TAKE 1 TABLET (100 MG TOTAL) BY MOUTH AS DIRECTED.   No current facility-administered medications for this visit. (Other)      REVIEW OF SYSTEMS: ROS   Negative for: Constitutional, Gastrointestinal, Neurological, Skin, Genitourinary, Musculoskeletal, HENT, Endocrine, Cardiovascular, Eyes, Respiratory, Psychiatric, Allergic/Imm, Heme/Lymph Last edited by Hurman Horn, MD on 09/25/2021 10:01 AM.       Fabienne Bruns  Allergies  Allergen Reactions   Benadryl [Diphenhydramine Hcl]     Hyperactivity,  Difficulty breathing, difficulty swallowing.  Pt denies allergy.   Effexor [Venlafaxine Hydrochloride] Swelling    Of throat   Lariam [Mefloquine]     Suicidal ideation    PAST MEDICAL HISTORY Past Medical History:  Diagnosis Date   Allergic rhinitis    Anxiety    Dermatitis    Heart murmur    Hyperlipidemia    Hypertension    Leg pain, bilateral    Malaria 1970 to 1982   UTI (lower urinary tract infection)    Past Surgical History:  Procedure Laterality Date   COLONOSCOPY  12/2009   TONSILLECTOMY  1950    FAMILY HISTORY Family History  Problem Relation Age of Onset   Cancer Mother        breast   Cancer Brother        brain    Cancer Brother        renal cancer     SOCIAL HISTORY Social History   Tobacco Use   Smoking status: Former   Smokeless tobacco: Never  Substance Use Topics   Alcohol use: Yes    Alcohol/week: 24.0 standard drinks    Types: 10 Glasses of wine, 14 Shots of liquor per week   Drug use: No         OPHTHALMIC EXAM:  Base Eye Exam     Visual Acuity (ETDRS)       Right Left   Dist [place] 20/60 +2 20/30 -1   Dist ph [place] NI NI         Tonometry (Tonopen, 9:25 AM)       Right Left   Pressure 18 17         Pupils       Pupils Shape React APD   Right PERRL Round Brisk None   Left PERRL Round Brisk None         Visual Fields (Counting fingers)       Left Right    Full Full         Extraocular Movement       Right Left    Full, Ortho Full, Ortho         Neuro/Psych     Oriented x3: Yes   Mood/Affect: Normal         Dilation     Right eye: 1.0% Mydriacyl, 2.5% Phenylephrine @ 9:25 AM           Slit Lamp and Fundus Exam     External Exam       Right Left   External Normal Normal         Slit Lamp Exam       Right Left   Lids/Lashes Normal Normal   Conjunctiva/Sclera White and quiet White and quiet   Cornea Clear Clear   Anterior Chamber Deep and quiet Deep and quiet   Iris Round and reactive Round and reactive   Lens 2+ Nuclear sclerosis 2+ Nuclear sclerosis   Anterior Vitreous Normal Normal         Fundus Exam       Right Left   Posterior Vitreous Posterior vitreous detachment, no gas remains in the eye    Disc Normal    C/D Ratio 0.35    Macula In the subfoveal location, darkened and less sub-RPE hemorrhage with adjacent to this region breakthrough darkened subretinal hemorrhage nodular approximately  9-10 disc areas in  size somewhat less nodular, mostly inferotemporal to the FAZ    Vessels Normal    Periphery Normal             IMAGING AND PROCEDURES  Imaging and Procedures for 09/25/21  Intravitreal Injection, Pharmacologic Agent - OD - Right  Eye       Time Out 09/25/2021. 10:02 AM. Confirmed correct patient, procedure, site, and patient consented.   Anesthesia Topical anesthesia was used. Anesthetic medications included Lidocaine 4%.   Procedure Preparation included 5% betadine to ocular surface, 10% betadine to eyelids, Tobramycin 0.3%. A 30 gauge needle was used.   Injection: 2.5 mg bevacizumab 2.5 MG/0.1ML   Route: Intravitreal, Site: Right Eye   NDC: [phone], Lot: [id number]   Post-op Post injection exam found visual acuity of at least counting fingers. The patient tolerated the procedure well. There were no complications. The patient received written and verbal post procedure care education. Post injection medications included ocuflox.      OCT, Retina - OU - Both Eyes       Right Eye Quality was good. Scan locations included subfoveal. Central Foveal Thickness: 306. Progression has improved. Findings include abnormal foveal contour, choroidal neovascular membrane.   Left Eye Quality was good. Scan locations included subfoveal. Central Foveal Thickness: 288. Progression has been stable. Findings include retinal drusen , abnormal foveal contour.   Notes Subretinal hemorrhage temporal to the FAZ region, with a large hemorrhagic sub-RPE elevation in a subfoveal location now visibly smaller and measurably smaller with intact foveal region OD, yet still with active old SR hemorrhage evident  OS drusen no sign of CNVM             ASSESSMENT/PLAN:  Retinal pigment epithelial detachment, right Large subfoveal RPE detachment vascularized onset 06/23/2021.  Subsequent treatment 12/12/1978 preoperative vision, with antivegF followed thereafter by SR hemorrhage which underwent displacement with intravitreal gas injection here in the office.  Scheduled follow-up in early December 2022 did not occur due to patient's family illness  Much smaller subfoveal vascularized PED NSR hemorrhage noted clinically and on OCT  today at 3 months postinjection yet still active will repeat recommend repeat injection today and follow-up again in 8 weeks      ICD-10-CM   1. Exudative age-related macular degeneration of right eye with active choroidal neovascularization (HCC)  H35.3211 Intravitreal Injection, Pharmacologic Agent - OD - Right Eye    OCT, Retina - OU - Both Eyes    bevacizumab (AVASTIN) SOSY 2.5 mg    2. Retinal pigment epithelial detachment, right  H35.721       1.  OD with vascularized PED with CNVM, subretinal hemorrhage, all of these conditions slightly improved including acuity.  45-month follow-up post injection Avastin.  Repeat injection today follow-up next again in 8 weeks  2.  3.  Ophthalmic Meds Ordered this visit:  Meds ordered this encounter  Medications   bevacizumab (AVASTIN) SOSY 2.5 mg       Return in about 8 weeks (around 11/20/2021) for dilate, OD, AVASTIN OCT.  There are no Patient Instructions on file for this visit.   Explained the diagnoses, plan, and follow up with the patient and they expressed understanding.  Patient expressed understanding of the importance of proper follow up care.   Clent Demark Deshannon Hinchliffe M.D. Diseases & Surgery of the Retina and Vitreous Retina & Diabetic [place] 09/25/21     Abbreviations: M myopia (nearsighted); A astigmatism; H hyperopia (farsighted); P presbyopia; Mrx spectacle prescription;  CTL  contact lenses; OD right eye; OS left eye; OU both eyes  XT exotropia; ET esotropia; PEK punctate epithelial keratitis; PEE punctate epithelial erosions; DES dry eye syndrome; MGD meibomian gland dysfunction; ATs artificial tears; PFAT's preservative free artificial tears; [place] nuclear sclerotic cataract; PSC posterior subcapsular cataract; ERM epi-retinal membrane; PVD posterior vitreous detachment; RD retinal detachment; DM diabetes mellitus; DR diabetic retinopathy; NPDR non-proliferative diabetic retinopathy; PDR proliferative diabetic retinopathy;  CSME clinically significant macular edema; DME diabetic macular edema; dbh dot blot hemorrhages; CWS cotton wool spot; POAG primary open angle glaucoma; C/D cup-to-disc ratio; HVF humphrey visual field; GVF goldmann visual field; OCT optical coherence tomography; IOP intraocular pressure; BRVO Branch retinal vein occlusion; CRVO central retinal vein occlusion; CRAO central retinal artery occlusion; BRAO branch retinal artery occlusion; RT retinal tear; SB scleral buckle; PPV pars plana vitrectomy; VH Vitreous hemorrhage; PRP panretinal laser photocoagulation; IVK intravitreal kenalog; VMT vitreomacular traction; MH Macular hole;  NVD neovascularization of the disc; NVE neovascularization elsewhere; AREDS age related eye disease study; ARMD age related macular degeneration; POAG primary open angle glaucoma; EBMD epithelial/anterior basement membrane dystrophy; ACIOL anterior chamber intraocular lens; IOL intraocular lens; PCIOL posterior chamber intraocular lens; Phaco/IOL phacoemulsification with intraocular lens placement; [place] photorefractive keratectomy; LASIK laser assisted in situ keratomileusis; HTN hypertension; DM diabetes mellitus; COPD chronic obstructive pulmonary disease

## 2021-09-25 NOTE — Assessment & Plan Note (Signed)
Large subfoveal RPE detachment vascularized onset 06/23/2021.  Subsequent treatment 12/12/1978 preoperative vision, with antivegF followed thereafter by SR hemorrhage which underwent displacement with intravitreal gas injection here in the office.  Scheduled follow-up in early December 2022 did not occur due to patient's family illness  Much smaller subfoveal vascularized PED NSR hemorrhage noted clinically and on OCT today at 3 months postinjection yet still active will repeat recommend repeat injection today and follow-up again in 8 weeks

## 2021-10-23 ENCOUNTER — Inpatient Hospital Stay (HOSPITAL_COMMUNITY)
Admission: EM | Admit: 2021-10-23 | Discharge: 2021-11-22 | DRG: 871 | Disposition: E | Payer: Medicare Other | Attending: Internal Medicine | Admitting: Internal Medicine

## 2021-10-23 ENCOUNTER — Emergency Department (HOSPITAL_COMMUNITY): Payer: Medicare Other

## 2021-10-23 ENCOUNTER — Encounter (HOSPITAL_COMMUNITY): Payer: Self-pay

## 2021-10-23 DIAGNOSIS — E876 Hypokalemia: Secondary | ICD-10-CM | POA: Diagnosis present

## 2021-10-23 DIAGNOSIS — D704 Cyclic neutropenia: Secondary | ICD-10-CM | POA: Diagnosis present

## 2021-10-23 DIAGNOSIS — N35912 Unspecified bulbous urethral stricture, male: Secondary | ICD-10-CM | POA: Diagnosis present

## 2021-10-23 DIAGNOSIS — R54 Age-related physical debility: Secondary | ICD-10-CM | POA: Diagnosis present

## 2021-10-23 DIAGNOSIS — Z66 Do not resuscitate: Secondary | ICD-10-CM | POA: Diagnosis present

## 2021-10-23 DIAGNOSIS — R6521 Severe sepsis with septic shock: Secondary | ICD-10-CM | POA: Diagnosis present

## 2021-10-23 DIAGNOSIS — S0083XA Contusion of other part of head, initial encounter: Secondary | ICD-10-CM

## 2021-10-23 DIAGNOSIS — F17210 Nicotine dependence, cigarettes, uncomplicated: Secondary | ICD-10-CM | POA: Diagnosis present

## 2021-10-23 DIAGNOSIS — Z7902 Long term (current) use of antithrombotics/antiplatelets: Secondary | ICD-10-CM | POA: Diagnosis not present

## 2021-10-23 DIAGNOSIS — F419 Anxiety disorder, unspecified: Secondary | ICD-10-CM | POA: Diagnosis present

## 2021-10-23 DIAGNOSIS — I471 Supraventricular tachycardia: Secondary | ICD-10-CM | POA: Diagnosis present

## 2021-10-23 DIAGNOSIS — F32A Depression, unspecified: Secondary | ICD-10-CM | POA: Diagnosis present

## 2021-10-23 DIAGNOSIS — W19XXXA Unspecified fall, initial encounter: Secondary | ICD-10-CM

## 2021-10-23 DIAGNOSIS — J9690 Respiratory failure, unspecified, unspecified whether with hypoxia or hypercapnia: Secondary | ICD-10-CM | POA: Diagnosis present

## 2021-10-23 DIAGNOSIS — N170 Acute kidney failure with tubular necrosis: Secondary | ICD-10-CM | POA: Diagnosis present

## 2021-10-23 DIAGNOSIS — Z515 Encounter for palliative care: Secondary | ICD-10-CM

## 2021-10-23 DIAGNOSIS — Y92009 Unspecified place in unspecified non-institutional (private) residence as the place of occurrence of the external cause: Secondary | ICD-10-CM

## 2021-10-23 DIAGNOSIS — W1830XA Fall on same level, unspecified, initial encounter: Secondary | ICD-10-CM | POA: Diagnosis present

## 2021-10-23 DIAGNOSIS — C7989 Secondary malignant neoplasm of other specified sites: Secondary | ICD-10-CM | POA: Diagnosis present

## 2021-10-23 DIAGNOSIS — J9601 Acute respiratory failure with hypoxia: Secondary | ICD-10-CM | POA: Diagnosis present

## 2021-10-23 DIAGNOSIS — I10 Essential (primary) hypertension: Secondary | ICD-10-CM | POA: Diagnosis present

## 2021-10-23 DIAGNOSIS — D6959 Other secondary thrombocytopenia: Secondary | ICD-10-CM | POA: Diagnosis present

## 2021-10-23 DIAGNOSIS — A419 Sepsis, unspecified organism: Secondary | ICD-10-CM | POA: Diagnosis present

## 2021-10-23 DIAGNOSIS — Z20822 Contact with and (suspected) exposure to covid-19: Secondary | ICD-10-CM | POA: Diagnosis present

## 2021-10-23 DIAGNOSIS — R7989 Other specified abnormal findings of blood chemistry: Secondary | ICD-10-CM | POA: Diagnosis present

## 2021-10-23 DIAGNOSIS — T1490XA Injury, unspecified, initial encounter: Secondary | ICD-10-CM

## 2021-10-23 DIAGNOSIS — J189 Pneumonia, unspecified organism: Secondary | ICD-10-CM | POA: Diagnosis present

## 2021-10-23 DIAGNOSIS — R296 Repeated falls: Secondary | ICD-10-CM | POA: Diagnosis present

## 2021-10-23 DIAGNOSIS — I4891 Unspecified atrial fibrillation: Secondary | ICD-10-CM | POA: Diagnosis present

## 2021-10-23 DIAGNOSIS — C3401 Malignant neoplasm of right main bronchus: Secondary | ICD-10-CM | POA: Diagnosis present

## 2021-10-23 DIAGNOSIS — Z888 Allergy status to other drugs, medicaments and biological substances status: Secondary | ICD-10-CM

## 2021-10-23 DIAGNOSIS — C787 Secondary malignant neoplasm of liver and intrahepatic bile duct: Secondary | ICD-10-CM | POA: Diagnosis present

## 2021-10-23 DIAGNOSIS — S2241XA Multiple fractures of ribs, right side, initial encounter for closed fracture: Secondary | ICD-10-CM | POA: Diagnosis present

## 2021-10-23 DIAGNOSIS — Z79899 Other long term (current) drug therapy: Secondary | ICD-10-CM

## 2021-10-23 DIAGNOSIS — R918 Other nonspecific abnormal finding of lung field: Secondary | ICD-10-CM

## 2021-10-23 HISTORY — DX: Essential (primary) hypertension: I10

## 2021-10-23 LAB — CBC
HCT: 43.3 % (ref 39.0–52.0)
HCT: 40.5 % (ref 39.0–52.0)
Hemoglobin: 14 g/dL (ref 13.0–17.0)
Hemoglobin: 13.1 g/dL (ref 13.0–17.0)
MCH: 28.1 pg (ref 26.0–34.0)
MCH: 28.5 pg (ref 26.0–34.0)
MCHC: 32.3 g/dL (ref 30.0–36.0)
MCHC: 32.3 g/dL (ref 30.0–36.0)
MCV: 86.9 fL (ref 80.0–100.0)
MCV: 88 fL (ref 80.0–100.0)
Platelets: 73 10*3/uL — ABNORMAL LOW (ref 150–400)
Platelets: 59 10*3/uL — ABNORMAL LOW (ref 150–400)
RBC: 4.98 MIL/uL (ref 4.22–5.81)
RBC: 4.6 MIL/uL (ref 4.22–5.81)
RDW: 14.9 % (ref 11.5–15.5)
RDW: 15 % (ref 11.5–15.5)
WBC: 1.5 10*3/uL — ABNORMAL LOW (ref 4.0–10.5)
WBC: 1.2 10*3/uL — CL (ref 4.0–10.5)
nRBC: 1.4 % — ABNORMAL HIGH (ref 0.0–0.2)
nRBC: 0 % (ref 0.0–0.2)

## 2021-10-23 LAB — I-STAT CHEM 8, ED
BUN: 33 mg/dL — ABNORMAL HIGH (ref 8–23)
Calcium, Ion: 0.93 mmol/L — ABNORMAL LOW (ref 1.15–1.40)
Chloride: 92 mmol/L — ABNORMAL LOW (ref 98–111)
Creatinine, Ser: 2.4 mg/dL — ABNORMAL HIGH (ref 0.61–1.24)
Glucose, Bld: 90 mg/dL (ref 70–99)
HCT: 41 % (ref 39.0–52.0)
Hemoglobin: 13.9 g/dL (ref 13.0–17.0)
Potassium: 2.9 mmol/L — ABNORMAL LOW (ref 3.5–5.1)
Sodium: 133 mmol/L — ABNORMAL LOW (ref 135–145)
TCO2: 29 mmol/L (ref 22–32)

## 2021-10-23 LAB — I-STAT ARTERIAL BLOOD GAS, ED
Acid-Base Excess: 1 mmol/L (ref 0.0–2.0)
Bicarbonate: 25.9 mmol/L (ref 20.0–28.0)
Calcium, Ion: 1.02 mmol/L — ABNORMAL LOW (ref 1.15–1.40)
HCT: 32 % — ABNORMAL LOW (ref 39.0–52.0)
Hemoglobin: 10.9 g/dL — ABNORMAL LOW (ref 13.0–17.0)
O2 Saturation: 94 %
Potassium: 2.9 mmol/L — ABNORMAL LOW (ref 3.5–5.1)
Sodium: 137 mmol/L (ref 135–145)
TCO2: 27 mmol/L (ref 22–32)
pCO2 arterial: 41.6 mmHg (ref 32–48)
pH, Arterial: 7.402 (ref 7.35–7.45)
pO2, Arterial: 72 mmHg — ABNORMAL LOW (ref 83–108)

## 2021-10-23 LAB — CORTISOL: Cortisol, Plasma: 72.2 ug/dL

## 2021-10-23 LAB — MAGNESIUM: Magnesium: 1.7 mg/dL (ref 1.7–2.4)

## 2021-10-23 LAB — LIPASE, BLOOD: Lipase: 25 U/L (ref 11–51)

## 2021-10-23 LAB — COMPREHENSIVE METABOLIC PANEL
ALT: 22 U/L (ref 0–44)
AST: 56 U/L — ABNORMAL HIGH (ref 15–41)
Albumin: 1.9 g/dL — ABNORMAL LOW (ref 3.5–5.0)
Alkaline Phosphatase: 53 U/L (ref 38–126)
Anion gap: 21 — ABNORMAL HIGH (ref 5–15)
BUN: 36 mg/dL — ABNORMAL HIGH (ref 8–23)
CO2: 23 mmol/L (ref 22–32)
Calcium: 8.1 mg/dL — ABNORMAL LOW (ref 8.9–10.3)
Chloride: 92 mmol/L — ABNORMAL LOW (ref 98–111)
Creatinine, Ser: 2.48 mg/dL — ABNORMAL HIGH (ref 0.61–1.24)
GFR, Estimated: 26 mL/min — ABNORMAL LOW (ref >60)
Glucose, Bld: 92 mg/dL (ref 70–99)
Potassium: 3.2 mmol/L — ABNORMAL LOW (ref 3.5–5.1)
Sodium: 136 mmol/L (ref 135–145)
Total Bilirubin: 0.9 mg/dL (ref 0.3–1.2)
Total Protein: 4.8 g/dL — ABNORMAL LOW (ref 6.5–8.1)

## 2021-10-23 LAB — LACTIC ACID, PLASMA
Lactic Acid, Venous: 7.4 mmol/L (ref 0.5–1.9)
Lactic Acid, Venous: 5.7 mmol/L (ref 0.5–1.9)
Lactic Acid, Venous: 4.3 mmol/L (ref 0.5–1.9)
Lactic Acid, Venous: 3.7 mmol/L (ref 0.5–1.9)

## 2021-10-23 LAB — CBG MONITORING, ED: Glucose-Capillary: 83 mg/dL (ref 70–99)

## 2021-10-23 LAB — AMYLASE: Amylase: 40 U/L (ref 28–100)

## 2021-10-23 LAB — PROCALCITONIN: Procalcitonin: 48.76 ng/mL

## 2021-10-23 LAB — RESP PANEL BY RT-PCR (FLU A&B, COVID) ARPGX2
Influenza A by PCR: NEGATIVE
Influenza B by PCR: NEGATIVE
SARS Coronavirus 2 by RT PCR: NEGATIVE

## 2021-10-23 LAB — PROTIME-INR
INR: 1.2 (ref 0.8–1.2)
Prothrombin Time: 15.6 seconds — ABNORMAL HIGH (ref 11.4–15.2)

## 2021-10-23 LAB — PHOSPHORUS: Phosphorus: 3.7 mg/dL (ref 2.5–4.6)

## 2021-10-23 LAB — ETHANOL: Alcohol, Ethyl (B): <10 mg/dL (ref <10)

## 2021-10-23 MED ORDER — ETOMIDATE 2 MG/ML IV SOLN
INTRAVENOUS | Status: AC | PRN
  Administered 2021-10-23: 5 mg via INTRAVENOUS

## 2021-10-23 MED ORDER — FENTANYL BOLUS VIA INFUSION
25.0000 ug | INTRAVENOUS | Status: DC | PRN
  Administered 2021-10-23 (×3): 50 ug via INTRAVENOUS
  Filled 2021-10-23: qty 100

## 2021-10-23 MED ORDER — MORPHINE SULFATE (PF) 2 MG/ML IV SOLN: 2.0000 mg | INTRAVENOUS | Status: DC | PRN

## 2021-10-23 MED ORDER — PHENYLEPHRINE HCL-NACL 20-0.9 MG/250ML-% IV SOLN: 25.0000 ug/min | INTRAVENOUS | Status: DC

## 2021-10-23 MED ORDER — DOCUSATE SODIUM 100 MG PO CAPS
100.0000 mg | ORAL_CAPSULE | Freq: Two times a day (BID) | ORAL | Status: DC | PRN
Start: 2021-10-23 — End: 2021-10-24

## 2021-10-23 MED ORDER — FENTANYL CITRATE PF 50 MCG/ML IJ SOSY
25.0000 ug | PREFILLED_SYRINGE | INTRAMUSCULAR | Status: DC | PRN
  Filled 2021-10-23: qty 1

## 2021-10-23 MED ORDER — SODIUM CHLORIDE 0.9 % IV SOLN: 2.0000 g | INTRAVENOUS | Status: DC

## 2021-10-23 MED ORDER — SODIUM CHLORIDE 0.9 % IV SOLN: 250.0000 mL | INTRAVENOUS | Status: DC

## 2021-10-23 MED ORDER — VANCOMYCIN HCL 1500 MG/300ML IV SOLN
1500.0000 mg | Freq: Once | INTRAVENOUS | Status: AC
  Administered 2021-10-23: 1500 mg via INTRAVENOUS
  Filled 2021-10-23: qty 300

## 2021-10-23 MED ORDER — DILTIAZEM HCL-DEXTROSE 125-5 MG/125ML-% IV SOLN (PREMIX)
5.0000 mg/h | INTRAVENOUS | Status: DC
  Administered 2021-10-23: 5 mg/h via INTRAVENOUS

## 2021-10-23 MED ORDER — GLYCOPYRROLATE 1 MG PO TABS: 1.0000 mg | ORAL_TABLET | ORAL | Status: DC | PRN

## 2021-10-23 MED ORDER — POLYETHYLENE GLYCOL 3350 17 G PO PACK: 17.0000 g | PACK | Freq: Every day | ORAL | Status: DC | PRN

## 2021-10-23 MED ORDER — FENTANYL 2500MCG IN NS 250ML (10MCG/ML) PREMIX INFUSION
25.0000 ug/h | INTRAVENOUS | Status: DC
Start: 2021-10-23 — End: 2021-10-23

## 2021-10-23 MED ORDER — FENTANYL 2500MCG IN NS 250ML (10MCG/ML) PREMIX INFUSION
25.0000 ug/h | INTRAVENOUS | Status: DC
  Administered 2021-10-23: 50 ug/h via INTRAVENOUS
  Filled 2021-10-23: qty 250

## 2021-10-23 MED ORDER — SODIUM CHLORIDE 0.9 % IV SOLN: INTRAVENOUS | Status: DC

## 2021-10-23 MED ORDER — DILTIAZEM HCL 25 MG/5ML IV SOLN
5.0000 mg | Freq: Once | INTRAVENOUS | Status: AC
  Administered 2021-10-23: 5 mg via INTRAVENOUS

## 2021-10-23 MED ORDER — SODIUM CHLORIDE 0.9 % IV SOLN
2.0000 g | Freq: Once | INTRAVENOUS | Status: AC
  Administered 2021-10-23: 2 g via INTRAVENOUS
  Filled 2021-10-23: qty 2

## 2021-10-23 MED ORDER — GLYCOPYRROLATE 0.2 MG/ML IJ SOLN: 0.2000 mg | INTRAMUSCULAR | Status: DC | PRN

## 2021-10-23 MED ORDER — MIDAZOLAM-SODIUM CHLORIDE 100-0.9 MG/100ML-% IV SOLN
0.5000 mg/h | INTRAVENOUS | Status: DC
  Administered 2021-10-23: 2 mg/h via INTRAVENOUS
  Filled 2021-10-23: qty 100

## 2021-10-23 MED ORDER — MORPHINE BOLUS VIA INFUSION: 5.0000 mg | INTRAVENOUS | Status: DC | PRN

## 2021-10-23 MED ORDER — FENTANYL BOLUS VIA INFUSION: 25.0000 ug | INTRAVENOUS | Status: DC | PRN

## 2021-10-23 MED ORDER — PANTOPRAZOLE SODIUM 40 MG IV SOLR: 40.0000 mg | Freq: Every day | INTRAVENOUS | Status: DC

## 2021-10-23 MED ORDER — FENTANYL CITRATE PF 50 MCG/ML IJ SOSY: 25.0000 ug | PREFILLED_SYRINGE | INTRAMUSCULAR | Status: DC | PRN

## 2021-10-23 MED ORDER — POLYVINYL ALCOHOL 1.4 % OP SOLN
1.0000 [drp] | Freq: Four times a day (QID) | OPHTHALMIC | Status: DC | PRN
  Filled 2021-10-23: qty 15

## 2021-10-23 MED ORDER — LACTATED RINGERS IV SOLN
INTRAVENOUS | Status: AC | PRN
Start: 2021-10-23 — End: 2021-10-23
  Administered 2021-10-23: 1000 mL via INTRAVENOUS

## 2021-10-23 MED ORDER — PHENYLEPHRINE HCL-NACL 20-0.9 MG/250ML-% IV SOLN
0.0000 ug/min | INTRAVENOUS | Status: DC
  Administered 2021-10-23: 20 ug/min via INTRAVENOUS
  Administered 2021-10-23: 90 ug/min via INTRAVENOUS
  Filled 2021-10-23 (×2): qty 250

## 2021-10-23 MED ORDER — MORPHINE 100MG IN NS 100ML (1MG/ML) PREMIX INFUSION: 0.0000 mg/h | INTRAVENOUS | Status: DC

## 2021-10-23 MED ORDER — SUCCINYLCHOLINE CHLORIDE 20 MG/ML IJ SOLN
INTRAMUSCULAR | Status: AC | PRN
  Administered 2021-10-23: 120 mg via INTRAVENOUS

## 2021-10-23 MED ORDER — DEXTROSE 5 % IV SOLN: INTRAVENOUS | Status: DC

## 2021-10-23 MED ORDER — POTASSIUM CHLORIDE 10 MEQ/100ML IV SOLN
10.0000 meq | INTRAVENOUS | Status: AC
  Administered 2021-10-23 (×4): 10 meq via INTRAVENOUS
  Filled 2021-10-23 (×4): qty 100

## 2021-10-23 MED ORDER — MIDAZOLAM BOLUS VIA INFUSION
2.0000 mg | Freq: Once | INTRAVENOUS | Status: AC
  Administered 2021-10-23: 2 mg via INTRAVENOUS
  Filled 2021-10-23: qty 2

## 2021-10-23 MED ORDER — METRONIDAZOLE 500 MG/100ML IV SOLN
500.0000 mg | Freq: Once | INTRAVENOUS | Status: AC
  Administered 2021-10-23: 500 mg via INTRAVENOUS

## 2021-10-23 MED ORDER — VANCOMYCIN VARIABLE DOSE PER UNSTABLE RENAL FUNCTION (PHARMACIST DOSING): Status: DC

## 2021-10-24 ENCOUNTER — Encounter (INDEPENDENT_AMBULATORY_CARE_PROVIDER_SITE_OTHER): Payer: Self-pay | Admitting: Ophthalmology

## 2021-10-24 LAB — SAMPLE TO BLOOD BANK

## 2021-10-28 LAB — CULTURE, BLOOD (ROUTINE X 2)
Culture: NO GROWTH
Culture: NO GROWTH
Culture: NO GROWTH
Culture: NO GROWTH
Special Requests: ADEQUATE
Special Requests: ADEQUATE
Special Requests: ADEQUATE
Special Requests: ADEQUATE

## 2021-11-20 ENCOUNTER — Encounter (INDEPENDENT_AMBULATORY_CARE_PROVIDER_SITE_OTHER): Payer: Medicare Other | Admitting: Ophthalmology

## 2021-11-22 NOTE — Progress Notes (Signed)
?   2021-10-30 0710  ?Clinical Encounter Type  ?Visited With Patient not available  ?Visit Type ED;Trauma  ?Referral From Nurse  ?Consult/Referral To None  ? ?Chaplain responded to level one trauma, patient was being assessed and treated by medical team.  ?There was no family present and I was unable to see patient due to care being provided.  ? ?Danice Goltz ?Chaplain Resident ?[facility] ?[phone] ?

## 2021-11-22 NOTE — TOC Progression Note (Signed)
Transition of Care (TOC) - Progression Note  ? ? ?Patient Details  ?Name: Eric Hoffman ?MRN: [id number] ?Date of Birth: Jul 15, 1941 ? ?Transition of Care (TOC) CM/SW Contact  ?Angelita Ingles, RN ?Phone Number:[phone] ? ?12-Nov-2021, 4:04 PM ? ?Clinical Narrative:    ? ?Transition of Care (TOC) Screening Note ? ? ?Patient Details  ?Name: Eric Hoffman ?Date of Birth: October 31, 1940 ? ? ?Transition of Care (TOC) CM/SW Contact:    ?Angelita Ingles, RN ?Phone Number: ?11-12-2021, 4:04 PM ? ? ? ?Transition of Care Department [facility]) has reviewed patient and no TOC needs have been identified at this time. We will continue to monitor patient advancement through interdisciplinary progression rounds. If new patient transition needs arise, please place a TOC consult. ? ? ? ? ?  ?  ? ?Expected Discharge Plan and Services ?  ?  ?  ?  ?  ?                ?  ?  ?  ?  ?  ?  ?  ?  ?  ?  ? ? ?Social Determinants of Health (SDOH) Interventions ?  ? ?Readmission Risk Interventions ?No flowsheet data found. ? ?

## 2021-11-22 NOTE — H&P (Signed)
? ?NAME:  Eric Hoffman, MRN:  [id number], DOB:  [birth year], LOS: 0 ?ADMISSION DATE:  November 13, 2021, CONSULTATION DATE: 13-Nov-2021 ?REFERRING MD: Emergency department physician, CHIEF COMPLAINT: Status post respiratory arrest in setting of new diagnosis of liver cancer with metastasis ? ?History of Present Illness:  ?81 year old male with recent diagnosis of liver cancer within the last 2 weeks without further work-up at this time.  Presents being found down required intubation treatment for A-fib rapid ventricular response.  Noted to be thrombocytopenic and neutropenic and hypokalemic.  Pulmonary critical care is asked to evaluate and admit.  His wife's name is Lelon Frohlich has made him a limited CODE BLUE no shock no CPR.  They really have not had time to process the diagnosis of liver cancer with metastases.  Continue interaction with them he may require comfort care soon as where unable to ventilate him well at this time.  He remains on 100% with a PO2 of 70.  We will admit to the intensive care unit continue current interventions without any escalation. ? ?Pertinent  Medical History  ? ?Past Medical History:  ?Diagnosis Date  ? Hypertension   ? ?Diagnosis of liver cancer ? ?Significant Hospital Events: ?Including procedures, antibiotic start and stop dates in addition to other pertinent events   ?Status post respiratory arrest ? ?Interim History / Subjective:  ?Currently intubated on vasopressor support ? ?Objective   ?Blood pressure (!) 82/60, pulse (!) 104, temperature 98.5 ?F (36.9 ?C), temperature source Oral, resp. rate (!) 22, height 5\' 11"  (1.803 m), weight 76.2 kg, SpO2 98 %. ?   ?Vent Mode: PRVC ?FiO2 (%):  [100 %] 100 % ?Set Rate:  [22 bmp] 22 bmp ?Vt Set:  [600 mL] 600 mL ?PEEP:  [5 cmH20] 5 cmH20 ?Plateau Pressure:  [16 cmH20] 16 cmH20  ? ?Intake/Output Summary (Last 24 hours) at 11-13-2021 1006 ?Last data filed at 11-13-21 1443 ?Gross per 24 hour  ?Intake 0 ml  ?Output 0 ml  ?Net 0 ml  ? ?Filed Weights  ? 11-13-21  0733  ?Weight: 76.2 kg  ? ? ?Examination: ?General: Frail elderly male who is currently sedated on mechanical ventilatory support ?HENT: Blood noted noted at left nares ?Lungs: Decreased breath sounds in the bases ?Cardiovascular: Sounds are regular sinus tach at 104 ?Abdomen: Nontender positive bowel sound ?Extremities: Lower extremities are cool but with palpable pulses ?Neuro: Currently heavily sedated but was following commands prior to sedation ?GU: Unable to pass Foley and urology has been consulted ? ?Resolved Hospital Problem list   ? ? ?Assessment & Plan:  ?Acute hypoxic respiratory failure in the setting of metastatic liver cancer, fractured ribs from fall, 3 assistant diagnosis of liver cancer but has not had a full work-up as of yet.  He was found down and required fluid resuscitation, cardioversion in the field for A-fib rapid ventricular response, transport to [facility] emergency department where he was intubated. ?Remained hypoxic despite being on 100% FiO2 ?He is now limited CODE BLUE no shock no CPR ?If not able to ventilate and oxygenate may need to transition to comfort care ?Empirical antimicrobial therapy for suspected pneumonia with a large right lung mass. ?Admit to the intensive care unit at this time as a limited code ? ?Liver cancer of unknown type with recent diagnosis with the last 2-week he has not had a chance to follow-up with hematology and oncology at this time. 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 ?Atrial fibrillation ventricular response ?Hypotension ?Currently off diltiazem drip with heart rate 104 ?Decrease sedation ?Start low-dose Neo-Synephrine ? ? 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 ? ? ?Hypokalemia ?Recent Labs  ?Lab 22-Nov-2021 ?[id number] 11-22-2021 ?0745 11/22/2021 ?7654  ?K 2.9* 3.2* 2.9*  ? ? ?Replete potassium ?Check magnesium and phosphorus ? ?Neutropenia  thrombocytopenia most likely secondary to new diagnosis of cancer. ? ?Best Practice (right click and "Reselect all SmartList Selections" daily)  ? ?Diet/type: NPO ?DVT prophylaxis: not indicated ?GI prophylaxis: PPI ?Lines: Central line ?Foley:  Yes, and it is still needed ?Code Status:  limited no shock or chest compressions ?Last date of multidisciplinary goals of care discussion [tbd] ?11-22-2021 wife Ann updated at bedside.  Is now a limited CODE BLUE no shock or chest compressions.  She may be ready to transition to full comfort care in the near future. ?Labs   ?CBC: ?Recent Labs  ?Lab 11-22-21 ?6503 2021-11-22 ?0745 11-22-2021 ?5465  ?WBC  --  1.5*  --   ?HGB 13.9 14.0 10.9*  ?HCT 41.0 43.3 32.0*  ?MCV  --  86.9  --   ?PLT  --  73*  --   ? ? ?Basic Metabolic Panel: ?Recent Labs  ?Lab 2021-11-22 ?[id number] 11/22/2021 ?0745 11-22-2021 ?7517  ?NA 133* 136 137  ?K 2.9* 3.2* 2.9*  ?CL 92* 92*  --   ?CO2  --  23  --   ?GLUCOSE 90 92  --   ?BUN 33* 36*  --   ?CREATININE 2.40* 2.48*  --   ?CALCIUM  --  8.1*  --   ? ?GFR: ?Estimated Creatinine Clearance: 25.3 mL/min (A) (by C-G formula based on SCr of 2.48 mg/dL (H)). ?Recent Labs  ?Lab 11-22-21 ?0745 22-Nov-2021 ?0017  ?WBC 1.5*  --   ?LATICACIDVEN  --  7.4*  ? ? ?Liver Function Tests: ?Recent Labs  ?Lab November 22, 2021 ?0745  ?AST 56*  ?ALT 22  ?ALKPHOS 53  ?BILITOT 0.9  ?PROT 4.8*  ?ALBUMIN 1.9*  ? ?No results for input(s): LIPASE, AMYLASE in the last 168 hours. ?No results for input(s): AMMONIA in the last 168 hours. ? ?ABG ?   ?Component Value Date/Time  ? PHART 7.402 2021-11-22 0942  ? PCO2ART 41.6 11/22/21 0942  ? PO2ART 72 (L) 11-22-21 0942  ? HCO3 25.9 11/22/21 0942  ? TCO2 27 11-22-2021 0942  ? O2SAT 94 11-22-2021 0942  ?  ? ?Coagulation Profile: ?Recent Labs  ?Lab 11/22/21 ?0745  ?INR 1.2  ? ? ?Cardiac Enzymes: ?No results for input(s): CKTOTAL, CKMB, CKMBINDEX, TROPONINI in the last 168 hours. ? ?HbA1C: ?No results found for: HGBA1C ? ?CBG: ?Recent Labs  ?Lab 22-Nov-2021 ?0720   ?GLUCAP 83  ? ? ?Review of Systems:   ?na ? ?Past Medical History:  ?He,  has a past medical history of Hypertension.  ? ?Surgical History:  ? ?Social History:  ? reports that he has been smoking cigarettes. He has never used smokeless tobacco. He reports that he does not currently use alcohol. He reports that he does not currently use drugs.  ? ?Family History:  ?His family history is not on file.  ? ?Allergies ?Allergies  ?Allergen Reactions  ? Benadryl [Diphenhydramine] Other (See Comments)  ?  Hyperactivity,  Difficulty breathing, difficulty swallowing.  Pt denies allergy.  ? Effexor [Venlafaxine] Swelling  ? Lariam [Mefloquine] Other (See Comments)  ?  Suicidal ideation  ? Statins Other (See Comments)  ?  Muscle  aches  ?  ? ?Home Medications  ?Prior to Admission medications   ?Medication Sig Start Date End Date Taking? Authorizing Provider  ?ALPRAZolam (XANAX) 1 MG tablet Take 1 mg by mouth 3 (three) times daily as needed for anxiety. 08/12/21   [provider]  ?atorvastatin (LIPITOR) 80 MG tablet Take 80 mg by mouth daily. 08/23/21   [provider]  ?clopidogrel (PLAVIX) 75 MG tablet Take 75 mg by mouth daily. 09/02/21   [provider]  ?Oxycodone HCl 10 MG TABS Take 10 mg by mouth 5 (five) times daily as needed for pain. 10/07/21   [provider]  ?prazosin (MINIPRESS) 2 MG capsule Take 4 mg by mouth 2 (two) times daily. 08/22/21   [provider]  ?zolpidem (AMBIEN) 10 MG tablet Take 10-15 mg by mouth at bedtime as needed for sleep. 08/12/21   [provider]  ?  ? ?Critical care time: 45 min ?  ? ?Richardson Landry Kira Hartl ACNP ?Acute Care Nurse Practitioner ?[place] ?Please consult Amion ?11/10/21, 10:07 AM ? ? ? ? ?

## 2021-11-22 NOTE — ED Triage Notes (Signed)
Pt BIB [facility] for eval s/p fall on thinners. Pt activated as Level 1 trauma for GCS of 10, AMS, SBP 80s in the field. EMS reports pt does take plavix, EMS also reports pt fell twice last night but he woouldnt let his wife call 911 until this AM. EMS found pt w/ HR of 150-200, he was cardioverted twice in the field w/out resolution. Pt arrives w/ HR of 200, SBP of 84/52 EMS found pt down on floor which EMS reports pt was down on floor for several hours. Pt arrives alert, altered to Trauma bay.  ?

## 2021-11-22 NOTE — Progress Notes (Signed)
?   Nov 17, 2021 1520  ?Clinical Encounter Type  ?Visited With Patient and family together;Health care provider  ?Visit Type Initial;Spiritual support;Other (Comment) ?(EOL)  ?Referral From Nurse  ?Consult/Referral To Chaplain  ? ?Chaplain received call regarding family request for prayer. Chaplain spoke with nurses Janett Billow and Shirlean Mylar) who stated that patient was actively dying. Chaplain provided emotional support and prayed with the wife and daughter of the patient. Chaplain stayed with the family who were shaken but strong in their belief.  Chaplain also provided support to nursing staff before leaving, offering a word of encouragement.  ? [name] ?[phone]  ?

## 2021-11-22 NOTE — Progress Notes (Signed)
Pharmacy Antibiotic Note ? ?Eric Hoffman is a 81 y.o. male admitted on 19-Nov-2021 with pneumonia.  Pharmacy has been consulted for Cefepime and vancomycin dosing. ? ?WBC low, LA 7.4. SCr 2.48 (BL ~ 0.8) ? ?Plan: ?-Cefepime 2 gm IV Q 24 hours ?-Vancomycin 1500 mg IV load followed by dosing per random levels until renal function improves ?-Monitor CBC, renal fx, cultures and clinical progress ? ?Height: 5\' 11"  (180.3 cm) ?Weight: 76.2 kg (168 lb) ?IBW/kg (Calculated) : 75.3 ? ?Temp (24hrs), Avg:98.5 ?F (36.9 ?C), Min:98.5 ?F (36.9 ?C), Max:98.5 ?F (36.9 ?C) ? ?Recent Labs  ?Lab November 19, 2021 ?[id number] 11-19-2021 ?0745 11/19/21 ?1103  ?WBC  --  1.5*  --   ?CREATININE 2.40* 2.48*  --   ?LATICACIDVEN  --   --  7.4*  ?  ?Estimated Creatinine Clearance: 25.3 mL/min (A) (by C-G formula based on SCr of 2.48 mg/dL (H)).   ? ?Allergies  ?Allergen Reactions  ? Benadryl [Diphenhydramine] Other (See Comments)  ?  Hyperactivity,  Difficulty breathing, difficulty swallowing.  Pt denies allergy.  ? Effexor [Venlafaxine] Swelling  ? Lariam [Mefloquine] Other (See Comments)  ?  Suicidal ideation  ? Statins Other (See Comments)  ?  Muscle aches  ? ? ?Antimicrobials this admission: ?Cefepime 3/2 >>  ?Vancomycin 3/2 >>  ? ?Dose adjustments this admission: ? ? ?Microbiology results: ?3/2 BCx:  ? ?Thank you for allowing pharmacy to be a part of this patient?s care. ? ?Albertina Parr, PharmD., BCCCP ?Clinical Pharmacist ?Please refer to AMION for unit-specific pharmacist  ? ? ?

## 2021-11-22 NOTE — ED Notes (Signed)
Trauma Response Nurse Documentation ? ? ?Eric Hoffman is a 81 y.o. male arriving to [facility] ED via [facility] ? ?Trauma was activated as a Level 1 by charge RN based on the following trauma criteria Anytime Systolic Blood Pressure < 90. Trauma team at the bedside on patient arrival. Patient cleared for CT by Dr. Grandville Silos. Patient to CT with team. GCS 10.  ? ?History  ? Past Medical History:  ?Diagnosis Date  ? Hypertension   ?  ?   ? ? ? ?Initial Focused Assessment (If applicable, or please see trauma documentation): ?See primary RN and MD notes ? ? ?CT's Completed:   ?CT Head, CT Maxillofacial, and CT C-Spine CT CAP without contrast ? ?Interventions:  ?IV fluids ?Port Xrays ?CT scans ?Intubation ?Central line placement ?Sedation/pain medication ?Cardizem gtt  ?Labs with blood cultures ? ?Plan for disposition:  ?Admission to ICU to CCM.  ? ?Consults completed:  ?none  ? ?Event Summary: ? ?Pt arrived from home after falling at least 2x last night and this am- was unable to get up on own- EMS arrived to find pt was hypotensive with erratic heart rate. Initially was 40 then 200-- was cardioverted enroute. Remains tachycardic on arrival. Zoll pads placed on patient on arrival.  ?20g IV in left hand per EMS, 2nd line started In left [facility] per Isabel Caprice, RN TRN,  ?Dr. Grandville Silos placed cordis in right groin.  ?Dr. Jeanell Sparrow intubated pt for airway protection.  ?Transported to CT with Primary RN, TRN, TMD, RT, EDP. ?CCM consulted after scans completed.  ? ? ? ?Lezlie Octave Eiliana Drone  ?Trauma Response RN ? ?Please call TRN at [phone] for further assistance. ? ? ?

## 2021-11-22 NOTE — Progress Notes (Signed)
?   November 05, 2021 0710  ?Clinical Encounter Type  ?Visited With Patient not available  ?Visit Type ED;Trauma  ?Referral From Nurse  ?Consult/Referral To None  ? ?Chaplain responded to a level one trauma. Patient was being assessed and treated by medical team.  ?There was no family present and because of care being provided I was unable to speak with patient.  ? ?Danice Goltz  ?Chaplain Resident  ?[facility]  ?[phone] ?

## 2021-11-22 NOTE — Death Summary Note (Signed)
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 year old male who presented to the [facility] ED as a trauma patient after a ground level fall on 11-05-21.  He had a recent diagnosis of metastatic cancer  within the last 2 weeks without further work-up at this time.  As an outpatient he had already decided not to undergo diagnosis or treatment. He was cleared by trauma and PCCM was consulted when required intubation treatment for A-fib rapid ventricular response.  Noted to be thrombocytopenic and neutropenic and hypokalemic.  Pulmonary critical care is asked to evaluate and admit.  CT Chest Abdomen Pelvis showed large right lung mass with adenopathy likely primary lung malignancy with extensive abdominal metastases. Care was discussed with his wife Lelon Frohlich who said he would want to be DNR. He was transferred to the [facility] and had worsening clinical decline with shock and refractory respiratory failure. His wife and daughter came to the bedside and he was made comfort care and he died with family at bedside.    Pertinent Labs and Studies  Significant Diagnostic Studies CT HEAD WO  CONTRAST  Result Date: 2021-11-05 CLINICAL DATA:  81 year old male status post fall. Level 1 trauma. EXAM: CT HEAD WITHOUT CONTRAST TECHNIQUE: Contiguous axial images were obtained from the base of the skull through the vertex without intravenous contrast. RADIATION DOSE REDUCTION: This exam was performed according to the departmental dose-optimization program which includes automated exposure control, adjustment of the mA and/or kV according to patient size and/or use of iterative reconstruction technique. COMPARISON:  Face and cervical spine CT today. FINDINGS: Brain: Incidental dural calcification along the falx. Chronic appearing bilateral cerebellar infarcts. Chronic encephalomalacia in the right occipital pole, right PCA territory. Small area of chronic encephalomalacia also in the posterosuperior right parietal lobe (series 3, image 25). No midline shift, ventriculomegaly, mass effect, evidence of mass lesion, intracranial hemorrhage or evidence of cortically based acute infarction. Vascular: Extensive Calcified atherosclerosis at the skull base. Skull: No fracture identified. Sinuses/Orbits: Low-density bubbly opacity in the right sphenoid sinus. Other Visualized paranasal sinuses and mastoids are clear. Other: Left posterior scalp hematoma or contusion on series 4, image 58. No scalp soft tissue gas. Underlying calvarium appears intact. Other orbit and scalp soft tissues appear negative. IMPRESSION: 1. Left posterior scalp hematoma or contusion without underlying skull fracture. 2. No acute traumatic injury to the brain identified. 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 3. Mild right sphenoid sinus inflammation. Electronically Signed   By: Genevie Ann M.D.   On: Nov 05, 2021 08:21   CT CERVICAL SPINE WO CONTRAST  Result Date: 11/05/21 CLINICAL DATA:  81 year old male status post fall. Level 1 trauma. Recently diagnosed with "metastatic cancer". EXAM: CT CERVICAL  SPINE WITHOUT CONTRAST TECHNIQUE:  Multidetector CT imaging of the cervical spine was performed without intravenous contrast. Multiplanar CT image reconstructions were also generated. RADIATION DOSE REDUCTION: This exam was performed according to the departmental dose-optimization program which includes automated exposure control, adjustment of the mA and/or kV according to patient size and/or use of iterative reconstruction technique. COMPARISON:  CT head and face today reported separately. FINDINGS: Alignment: Preserved cervical lordosis. Cervicothoracic junction alignment is within normal limits. Bilateral posterior element alignment is within normal limits. Skull base and vertebrae: Visualized skull base is intact. No atlanto-occipital dissociation. C1 and C2 appear intact and aligned. No acute osseous abnormality identified. Soft tissues and spinal canal: No prevertebral fluid or swelling. No visible canal hematoma. Intubated. Endotracheal tube courses appropriately into the trachea. Small volume retained secretions in the pharynx. Right greater than left cervical carotid artery atherosclerosis. No discrete soft tissue injury identified in the neck. Disc levels: Age-appropriate cervical spine degeneration. No spinal stenosis suspected. Upper chest: Abnormal. Partially visible malignant-appearing mediastinal lymphadenopathy. Low-density pleural effusion and extensive airspace disease in the right lung apex. Grossly intact visible upper thoracic levels. Contralateral left upper lobe centrilobular emphysema. IMPRESSION: 1. No acute traumatic injury identified in the cervical spine. 2. Abnormal upper chest - see CT Chest, Abdomen, and Pelvis today reported separately. Electronically Signed   By: Genevie Ann M.D.   On: Nov 16, 2021 08:39   DG Pelvis Portable  Result Date: 11/16/2021 CLINICAL DATA:  81 year old male status post fall.  Level 1 trauma. EXAM: PORTABLE PELVIS 1-2 VIEWS COMPARISON:  None. FINDINGS: Portable  AP supine view at 0737 hours. Femoral heads are normally located. Inferior pubic rami not included, but otherwise the visible pelvis appears intact. Grossly intact proximal femurs. Pubic symphysis and SI joints appear within normal limits. Negative visible bowel gas pattern. Pelvic phleboliths. IMPRESSION: No acute fracture or dislocation identified about the visible pelvis. Electronically Signed   By: Genevie Ann M.D.   On: November 16, 2021 07:44   DG Chest Portable 1 View  Result Date: 11-16-2021 CLINICAL DATA:  81 year old male status post fall. Level 1 trauma. Recently diagnosed with "metastatic cancer". EXAM: PORTABLE CHEST 1 VIEW COMPARISON:  Chest CT 0817 hours today.  Portable chest 0734 hours. FINDINGS: Portable AP semi upright view at 0843 hours. Intubated. Endotracheal tube tip in good position just below the clavicles. Slightly lower right lung volume. Extensive right lung opacification, see chest CT reported separately. No pneumothorax. Left lung appears stable and negative. Mildly displaced right 5th rib fracture redemonstrated. IMPRESSION: 1. Endotracheal tube tip in good position. 2. Extensive right lung opacification, see Chest CT reported separately. 3. Right 5th rib fracture. Electronically Signed   By: Genevie Ann M.D.   On: Nov 16, 2021 08:54   DG Chest Port 1 View  Result Date: Nov 16, 2021 CLINICAL DATA:  81 year old male status post fall. Level 1 trauma. EXAM: PORTABLE CHEST 1 VIEW COMPARISON:  None. FINDINGS: Portable AP supine view at 0734 hours. Moderate to large confluent opacity throughout the right lower lung, most pronounced at and inferior to the hilum but there is also patchy right upper lobe opacity. Displaced right lateral 5th rib fracture. No pneumothorax is evident. The right costophrenic angle is aerated, arguing against a right pleural effusion. Normal cardiac size and mediastinal contours. Visualized tracheal air column is within normal limits. Allowing for portable technique the left  lung appears are clear. No other No acute osseous abnormality identified. IMPRESSION: 1. Displaced right lateral 5th rib fracture with nearby confluent, multifocal right lower lung opacity. Differential considerations  include pulmonary contusions, aspiration, and less likely nontraumatic etiology such as pneumonia or mass. No definite pneumothorax or hemothorax on this supine view. 2. Mediastinal contours and the left lung appear negative. Electronically Signed   By: Genevie Ann M.D.   On: 11-15-21 07:47   CT CHEST ABDOMEN PELVIS WO CONTRAST  Result Date: 15-Nov-2021 CLINICAL DATA:  81 year old male status post fall. Level 1 trauma. Recently diagnosed with "metastatic cancer" . Renal insufficiency precludes IV contrast. EXAM: CT CHEST, ABDOMEN AND PELVIS WITHOUT CONTRAST TECHNIQUE: Multidetector CT imaging of the chest, abdomen and pelvis was performed following the standard protocol without IV contrast. RADIATION DOSE REDUCTION: This exam was performed according to the departmental dose-optimization program which includes automated exposure control, adjustment of the mA and/or kV according to patient size and/or use of iterative reconstruction technique. COMPARISON:  Plain films and cervical spine CT today. Outside lumbar MRI 02/11/2018. FINDINGS: CT CHEST FINDINGS Cardiovascular: Cardiac size within normal limits. No pericardial effusion. Calcified aortic atherosclerosis. Calcified coronary artery atherosclerosis and/or stents. Vascular patency is not evaluated in the absence of IV contrast. No evidence of periaortic hematoma on this noncontrast exam. Mediastinum/Nodes: Bulky mediastinal lymphadenopathy and/or ex nodal tumor (precarinal series 3, image 23 up to 6.5 cm long axis). Right hilum is abnormal. Left hilum appears to remain normal. No areas suspicious for mediastinal hematoma. Lungs/Pleura: Intubated. Endotracheal tube tip in good position above the carina. Left lung centrilobular emphysema with mild  dependent and lung base patchy opacity favored to be atelectasis. Opacified bronchus intermedius and right hilar airways. Widespread airspace opacification throughout the right lung. Intermittent air bronchograms. Superimposed small and probably loculated low-density pleural effusion most visible in the apex. No superimposed pneumothorax. Musculoskeletal: Chronic T12 augmented compression fracture, stable since 2019. Visible shoulder osseous structures appear intact. Intact sternum. No acute left rib fracture identified, although suspicion of healed chronic posterior 4th through 6th left rib fractures. Displaced right lateral 5th rib fracture, nearly 1 full shaft width displacement. Superimposed nondisplaced right anterolateral 4th and 6th rib fractures. CT ABDOMEN PELVIS FINDINGS Hepatobiliary: Streak artifact, but there are several indistinct low-density areas in the liver including at the dome on series 3, image 46 suspicious for hepatic metastatic disease in this setting. Small volume simple fluid density perihepatic ascites. Grossly negative gallbladder. Pancreas: Negative. Spleen: Small volume low-density perisplenic fluid. Calcified splenic granulomas. Adrenals/Urinary Tract: Abnormally thickened bilateral adrenal glands. Bilateral adrenal metastases not excluded. No evidence of renal or adrenal trauma. Bilateral renal artery calcification and/or nephrolithiasis. No hydronephrosis or hydroureter. Within the bladder there are 1 or more small dependent stones on the right near the right UVJ, 4-5 mm. The distal ureter is decompressed. Stomach/Bowel: Small volume low-density ascites. No free air. Stomach and duodenum are within normal limits. But there is widespread abnormal appearance of the greater omentum which appears nodular and thickened. No superimposed dilated small or large bowel. Small bowel loop detail is obscured in many areas by the omental tumor. Vascular/Lymphatic: Extensive Aortoiliac calcified  atherosclerosis. Vascular patency is not evaluated in the absence of IV contrast. No discrete lymphadenopathy in the absence of IV contrast. Reproductive: Prostatomegaly. Other: Trace pelvic ascites with simple fluid density. Pelvic phleboliths. Musculoskeletal: Normal lumbar segmentation. Chronic L3 superior endplate Schmorl's nodes. No acute or suspicious osseous finding in the lumbar spine, pelvis or proximal femurs. IMPRESSION: TRAUMA FINDINGS 1. Acute fractures of the right 5th, 4th, and 6th ribs. No pneumothorax. No evidence of hemothorax. 2. Severe right lung and mediastinal abnormality, although appears nontraumatic (see #  1 below). 3. Endotracheal tube in good position. 4. No other acute traumatic injury identified in the non-contrast chest, abdomen, or pelvis. NONTRAUMATIC FINDINGS 1. Bulky and malignant mediastinal lymphadenopathy with subtotal opacification of the right lung. Obstructing right hilar tumor suspected on this noncontrast exam. Lobar airspace disease throughout the right lung could be postobstructive pneumonia, aspiration. Small transudate pleural effusion. Underlying Emphysema (ICD10-J43.9). 2. Strong evidence of omental metastatic disease in the abdomen with small volume ascites. 3. Appearance suspicious for hepatic and adrenal metastases on this noncontrast exam. 4. Nonobstructed kidneys but positive for nephrolithiasis, small bladder stones. Benign chronic T12 compression fracture previously augmented. Extensive Aortic Atherosclerosis (ICD10-I70.0). Study reviewed in person with Dr. Georganna Skeans on 11/16/2021 at 0830 hours. Electronically Signed   By: Genevie Ann M.D.   On: 2021-11-16 08:52   CT MAXILLOFACIAL WO CONTRAST  Result Date: November 16, 2021 CLINICAL DATA:  81 year old male status post fall. Level 1 trauma. Epistaxis. EXAM: CT MAXILLOFACIAL WITHOUT CONTRAST TECHNIQUE: Multidetector CT imaging of the maxillofacial structures was performed. Multiplanar CT image reconstructions were also  generated. RADIATION DOSE REDUCTION: This exam was performed according to the departmental dose-optimization program which includes automated exposure control, adjustment of the mA and/or kV according to patient size and/or use of iterative reconstruction technique. COMPARISON:  CT head and cervical spine today. FINDINGS: Osseous: Mandible intact and normally located. No acute dental finding. No maxilla, pterygoid, zygoma, or nasal bone fracture. Central skull base appears intact. Orbits: No orbital wall fracture. Globes and intraorbital soft tissues appear normal. Sinuses: Small volume layering fluid in the right sphenoid sinus. Bubbly opacity in the bilateral nasal cavity with mild nasal septal deviation which is age indeterminate. But no nasal septal hematoma. Other paranasal sinuses are clear. Tympanic cavities and mastoids are clear. Soft tissues: Intubated, small volume fluid in the pharynx. Calcified carotid atherosclerosis greater on the right. Otherwise negative visible noncontrast deep soft tissue spaces of the face. Limited intracranial: Reported separately today. IMPRESSION: No acute facial fracture identified. Retained secretions in the nasal cavity may correspond to epistaxis. And nasal septal deviation is age indeterminate but there is no nasal bone fracture. Electronically Signed   By: Genevie Ann M.D.   On: November 16, 2021 08:35    Microbiology Recent Results (from the past 240 hour(s))  Resp Panel by RT-PCR (Flu A&B, Covid) Nasopharyngeal Swab     Status: None   Collection Time: Nov 16, 2021  7:23 AM   Specimen: Nasopharyngeal Swab; Nasopharyngeal(NP) swabs in vial transport medium  Result Value Ref Range Status   SARS Coronavirus 2 by RT PCR NEGATIVE NEGATIVE Final    Comment: (NOTE) SARS-CoV-2 target nucleic acids are NOT DETECTED.  The SARS-CoV-2 RNA is generally detectable in upper respiratory specimens during the acute phase of infection. The lowest concentration of SARS-CoV-2 viral copies  this assay can detect is 138 copies/mL. A negative result does not preclude SARS-Cov-2 infection and should not be used as the sole basis for treatment or other patient management decisions. A negative result may occur with  improper specimen collection/handling, submission of specimen other than nasopharyngeal swab, presence of viral mutation(s) within the areas targeted by this assay, and inadequate number of viral copies(<138 copies/mL). A negative result must be combined with clinical observations, patient history, and epidemiological information. The expected result is Negative.  Fact Sheet for Patients:  EntrepreneurPulse.com.au  Fact Sheet for Healthcare Providers:  IncredibleEmployment.be  This test is no t yet approved or cleared by the [place] and  has been authorized  for detection and/or diagnosis of SARS-CoV-2 by FDA under an Emergency Use Authorization (EUA). This EUA will remain  in effect (meaning this test can be used) for the duration of the COVID-19 declaration under Section 564(b)(1) of the Act, 21 U.S.C.section 360bbb-3(b)(1), unless the authorization is terminated  or revoked sooner.       Influenza A by PCR NEGATIVE NEGATIVE Final   Influenza B by PCR NEGATIVE NEGATIVE Final    Comment: (NOTE) The Xpert Xpress SARS-CoV-2/FLU/RSV plus assay is intended as an aid in the diagnosis of influenza from Nasopharyngeal swab specimens and should not be used as a sole basis for treatment. Nasal washings and aspirates are unacceptable for Xpert Xpress SARS-CoV-2/FLU/RSV testing.  Fact Sheet for Patients: EntrepreneurPulse.com.au  Fact Sheet for Healthcare Providers: IncredibleEmployment.be  This test is not yet approved or cleared by the [place] FDA and has been authorized for detection and/or diagnosis of SARS-CoV-2 by FDA under an Emergency Use Authorization (EUA). This EUA  will remain in effect (meaning this test can be used) for the duration of the COVID-19 declaration under Section 564(b)(1) of the Act, 21 U.S.C. section 360bbb-3(b)(1), unless the authorization is terminated or revoked.  Performed at [facility], [place] [address] [place], [place] [address]     Lab Basic Metabolic Panel: Recent Labs  Lab 10/25/2021 [phone] 10-25-2021 0745 10/25/2021 0942 2021-10-25 1121  NA 133* 136 137  --   K 2.9* 3.2* 2.9*  --   CL 92* 92*  --   --   CO2  --  23  --   --   GLUCOSE 90 92  --   --   BUN 33* 36*  --   --   CREATININE 2.40* 2.48*  --   --   CALCIUM  --  8.1*  --   --   MG  --   --   --  1.7  PHOS  --   --   --  3.7   Liver Function Tests: Recent Labs  Lab October 25, 2021 0745  AST 56*  ALT 22  ALKPHOS 53  BILITOT 0.9  PROT 4.8*  ALBUMIN 1.9*   Recent Labs  Lab 2021-10-25 1121  LIPASE 25  AMYLASE 40   No results for input(s): AMMONIA in the last 168 hours. CBC: Recent Labs  Lab Oct 25, 2021 0737 2021/10/25 0745 10-25-2021 0942 10/25/21 1121  WBC  --  1.5*  --  1.2*  HGB 13.9 14.0 10.9* 13.1  HCT 41.0 43.3 32.0* 40.5  MCV  --  86.9  --  88.0  PLT  --  73*  --  59*   Cardiac Enzymes: No results for input(s): CKTOTAL, CKMB, CKMBINDEX, TROPONINI in the last 168 hours. Sepsis Labs: Recent Labs  Lab 25-Oct-2021 0745 10/25/21 0747 Oct 25, 2021 0933 Oct 25, 2021 1121 10/25/2021 1124 10-25-21 1436  PROCALCITON  --   --   --  48.76  --   --   WBC 1.5*  --   --  1.2*  --   --   LATICACIDVEN  --  7.4* 5.7*  --  4.3* 3.7*

## 2021-11-22 NOTE — ED Notes (Signed)
MD ray at bedside preparing to intubate ?

## 2021-11-22 NOTE — Procedures (Signed)
Central line ? ?Date/Time: October 25, 2021 8:50 AM ?Performed by: Georganna Skeans, MD ?Authorized by: Georganna Skeans, MD  ? ?Consent:  ?  Consent obtained:  Emergent situation ?Pre-procedure details:  ?  Indication(s): central venous access   ?  Hand hygiene: Hand hygiene performed prior to insertion   ?  Skin preparation:  Chlorhexidine ?Sedation:  ?  Sedation type:  None ?Anesthesia:  ?  Anesthesia method:  None ?Procedure details:  ?  Location:  R femoral ?  Procedural supplies:  Cordis ?  Landmarks identified: yes   ?  Ultrasound guidance: yes   ?  Sterile ultrasound techniques: Sterile gel and sterile probe covers were used   ?  Number of attempts:  3 ?Post-procedure details:  ?  Post-procedure:  Dressing applied and line sutured ?  Assessment:  Free fluid flow ? ?Georganna Skeans, MD, MPH, FACS ?Please use [email or web address] to contact on call provider ? ?

## 2021-11-22 NOTE — ED Provider Notes (Addendum)
[facility] EMERGENCY DEPARTMENT Provider Note   CSN: [id number] Arrival date & time: 11-13-21  1194     History  Chief Complaint  Patient presents with   Level 1 Fall     Eric Hoffman is a 81 y.o. male.  HPI Level 5 caveat level 50 trauma 81 year old male brought to the ED via EMS with reports that he had 2 falls last night and was found on the ground.  They report that he has been hypotensive, tachycardic, altered mental status, and tachypneic.  Blood was noted around his head.  EMS reports that patient is full code.  They report that they cardioverted him twice and he went into a sinus rhythm but reverted to a rapid narrow rhythm again.     Home Medications Prior to Admission medications   Medication Sig Start Date End Date Taking? Authorizing Provider  ALPRAZolam Duanne Moron) 1 MG tablet Take 1 mg by mouth 3 (three) times daily as needed for anxiety. 08/12/21   [provider]  atorvastatin (LIPITOR) 80 MG tablet Take 80 mg by mouth daily. 08/23/21   [provider]  clopidogrel (PLAVIX) 75 MG tablet Take 75 mg by mouth daily. 09/02/21   [provider]  Oxycodone HCl 10 MG TABS Take 10 mg by mouth 5 (five) times daily as needed for pain. 10/07/21   [provider]  prazosin (MINIPRESS) 2 MG capsule Take 4 mg by mouth 2 (two) times daily. 08/22/21   [provider]  zolpidem (AMBIEN) 10 MG tablet Take 10-15 mg by mouth at bedtime as needed for sleep. 08/12/21   [provider]      Allergies    Benadryl [diphenhydramine], Effexor [venlafaxine], Lariam [mefloquine], and Statins    Review of Systems   Review of Systems  Unable to perform ROS: Acuity of condition   Physical Exam Updated Vital Signs BP 129/73    Pulse (!) 114    Temp 98.5 F (36.9 C) (Oral)    Resp (!) 22    Ht 1.803 m (5\' 11" )    Wt 76.2 kg    SpO2 97%    BMI 23.43 kg/m  Physical Exam Vitals and nursing note reviewed.  Constitutional:       General: He is in acute distress.     Appearance: He is ill-appearing.  HENT:     Head: Normocephalic.     Comments: Dried blood noted around nares and lips     Right Ear: External ear normal.     Left Ear: External ear normal.     Nose:     Comments: Dried blood noted around nares    Mouth/Throat:     Mouth: Mucous membranes are dry.     Comments: Dried lips with dried blood on teeth No tooth injury is noted Oropharynx is dry and clear with some dried blood noted Eyes:     Pupils: Pupils are equal, round, and reactive to light.  Neck:     Comments: Cervical collar is in place Trachea is midline No JVD is noted Cardiovascular:     Rate and Rhythm: Tachycardia present. Rhythm irregular.  Pulmonary:     Effort: Respiratory distress present.     Breath sounds: Rhonchi present.     Comments: Patient is tachypneic with increased work of breathing Breath sounds are decreased on the right There is mottling versus bruising noted more on the right than left No crepitus Abdominal:     Comments: Abdomen  is firm but not rigid No masses are palpated Does not appear to be tender   Musculoskeletal:     Comments: Patient is moving both of his arms actively try to remove oxygen There are some contusions, mottling, No obvious deformities or lacerations noted Pelvis appears stable On back exam no obvious trauma  Skin:    General: Skin is dry.     Comments: Skin is dry and mild diffusely  Neurological:     Mental Status: He is alert.     Comments: Patient is able to state his name but is otherwise unable to give any other information As noted above he seems to move both of his arms equally Pupils are equal extraocular movements appear to be intact Patient appears to move his toes equally but has not actively moved his knees or hips    ED Results / Procedures / Treatments   Labs (all labs ordered are listed, but only abnormal results are displayed) Labs Reviewed  COMPREHENSIVE  METABOLIC PANEL - Abnormal; Notable for the following components:      Result Value   Potassium 3.2 (*)    BUN 36 (*)    Creatinine, Ser 2.48 (*)    Calcium 8.1 (*)    Total Protein 4.8 (*)    Albumin 1.9 (*)    AST 56 (*)    GFR, Estimated 26 (*)    All other components within normal limits  CBC - Abnormal; Notable for the following components:   WBC 1.5 (*)    Platelets 73 (*)    nRBC 1.4 (*)    All other components within normal limits  LACTIC ACID, PLASMA - Abnormal; Notable for the following components:   Lactic Acid, Venous 7.4 (*)    All other components within normal limits  PROTIME-INR - Abnormal; Notable for the following components:   Prothrombin Time 15.6 (*)    All other components within normal limits  I-STAT CHEM 8, ED - Abnormal; Notable for the following components:   Sodium 133 (*)    Potassium 2.9 (*)    Chloride 92 (*)    BUN 33 (*)    Creatinine, Ser 2.40 (*)    Calcium, Ion 0.93 (*)    All other components within normal limits  RESP PANEL BY RT-PCR (FLU A&B, COVID) ARPGX2  CULTURE, BLOOD (ROUTINE X 2)  CULTURE, BLOOD (ROUTINE X 2)  ETHANOL  URINALYSIS, ROUTINE W REFLEX MICROSCOPIC  CBG MONITORING, ED  SAMPLE TO BLOOD BANK    EKG EKG Interpretation  Date/Time:  November 09, 2021 09:01:16 EST Ventricular Rate:  103 PR Interval:  120 QRS Duration: 148 QT Interval:  362 QTC Calculation: 474 R Axis:   91 Text Interpretation: Sinus tachycardia Atrial premature complexes RBBB and LPFB Confirmed by Pattricia Boss [phone]) on November 09, 2021 9:21:18 AM First EKG reviewed and interpretation in MUSE with for ventricular tachycardia with the bundle branch blocks as noted above, unclear if A-fib RVR versus SVT Radiology CT HEAD WO CONTRAST  Result Date: 11-09-2021 CLINICAL DATA:  81 year old male status post fall. Level 1 trauma. EXAM: CT HEAD WITHOUT CONTRAST TECHNIQUE: Contiguous axial images were obtained from the base of the skull through the vertex  without intravenous contrast. RADIATION DOSE REDUCTION: This exam was performed according to the departmental dose-optimization program which includes automated exposure control, adjustment of the mA and/or kV according to patient size and/or use of iterative reconstruction technique. COMPARISON:  Face and cervical spine CT today. FINDINGS: Brain: Incidental  dural calcification along the falx. Chronic appearing bilateral cerebellar infarcts. Chronic encephalomalacia in the right occipital pole, right PCA territory. Small area of chronic encephalomalacia also in the posterosuperior right parietal lobe (series 3, image 25). No midline shift, ventriculomegaly, mass effect, evidence of mass lesion, intracranial hemorrhage or evidence of cortically based acute infarction. Vascular: Extensive Calcified atherosclerosis at the skull base. Skull: No fracture identified. Sinuses/Orbits: Low-density bubbly opacity in the right sphenoid sinus. Other Visualized paranasal sinuses and mastoids are clear. Other: Left posterior scalp hematoma or contusion on series 4, image 58. No scalp soft tissue gas. Underlying calvarium appears intact. Other orbit and scalp soft tissues appear negative. IMPRESSION: 1. Left posterior scalp hematoma or contusion without underlying skull fracture. 2. No acute traumatic injury to the brain identified. 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 3. Mild right sphenoid sinus inflammation. Electronically Signed   By: Genevie Ann M.D.   On: 11-03-2021 08:21   CT CERVICAL SPINE WO CONTRAST  Result Date: Nov 03, 2021 CLINICAL DATA:  81 year old male status post fall. Level 1 trauma. Recently diagnosed with "metastatic cancer". EXAM: CT CERVICAL SPINE WITHOUT CONTRAST TECHNIQUE: Multidetector CT imaging of the cervical spine was performed without intravenous contrast. Multiplanar CT image reconstructions were also generated. RADIATION DOSE REDUCTION: This exam was  performed according to the departmental dose-optimization program which includes automated exposure control, adjustment of the mA and/or kV according to patient size and/or use of iterative reconstruction technique. COMPARISON:  CT head and face today reported separately. FINDINGS: Alignment: Preserved cervical lordosis. Cervicothoracic junction alignment is within normal limits. Bilateral posterior element alignment is within normal limits. Skull base and vertebrae: Visualized skull base is intact. No atlanto-occipital dissociation. C1 and C2 appear intact and aligned. No acute osseous abnormality identified. Soft tissues and spinal canal: No prevertebral fluid or swelling. No visible canal hematoma. Intubated. Endotracheal tube courses appropriately into the trachea. Small volume retained secretions in the pharynx. Right greater than left cervical carotid artery atherosclerosis. No discrete soft tissue injury identified in the neck. Disc levels: Age-appropriate cervical spine degeneration. No spinal stenosis suspected. Upper chest: Abnormal. Partially visible malignant-appearing mediastinal lymphadenopathy. Low-density pleural effusion and extensive airspace disease in the right lung apex. Grossly intact visible upper thoracic levels. Contralateral left upper lobe centrilobular emphysema. IMPRESSION: 1. No acute traumatic injury identified in the cervical spine. 2. Abnormal upper chest - see CT Chest, Abdomen, and Pelvis today reported separately. Electronically Signed   By: Genevie Ann M.D.   On: 11/03/2021 08:39   DG Pelvis Portable  Result Date: 11-03-2021 CLINICAL DATA:  81 year old male status post fall.  Level 1 trauma. EXAM: PORTABLE PELVIS 1-2 VIEWS COMPARISON:  None. FINDINGS: Portable AP supine view at 0737 hours. Femoral heads are normally located. Inferior pubic rami not included, but otherwise the visible pelvis appears intact. Grossly intact proximal femurs. Pubic symphysis and SI joints appear within  normal limits. Negative visible bowel gas pattern. Pelvic phleboliths. IMPRESSION: No acute fracture or dislocation identified about the visible pelvis. Electronically Signed   By: Genevie Ann M.D.   On: 11/03/21 07:44   DG Chest Portable 1 View  Result Date: 03-Nov-2021 CLINICAL DATA:  81 year old male status post fall. Level 1 trauma. Recently diagnosed with "metastatic cancer". EXAM: PORTABLE CHEST 1 VIEW COMPARISON:  Chest CT 0817 hours today.  Portable chest 0734 hours. FINDINGS: Portable AP semi upright view at 0843 hours. Intubated. Endotracheal tube tip in good position just below the clavicles. Slightly lower  right lung volume. Extensive right lung opacification, see chest CT reported separately. No pneumothorax. Left lung appears stable and negative. Mildly displaced right 5th rib fracture redemonstrated. IMPRESSION: 1. Endotracheal tube tip in good position. 2. Extensive right lung opacification, see Chest CT reported separately. 3. Right 5th rib fracture. Electronically Signed   By: Genevie Ann M.D.   On: 11/06/2021 08:54   DG Chest Port 1 View  Result Date: 06-Nov-2021 CLINICAL DATA:  81 year old male status post fall. Level 1 trauma. EXAM: PORTABLE CHEST 1 VIEW COMPARISON:  None. FINDINGS: Portable AP supine view at 0734 hours. Moderate to large confluent opacity throughout the right lower lung, most pronounced at and inferior to the hilum but there is also patchy right upper lobe opacity. Displaced right lateral 5th rib fracture. No pneumothorax is evident. The right costophrenic angle is aerated, arguing against a right pleural effusion. Normal cardiac size and mediastinal contours. Visualized tracheal air column is within normal limits. Allowing for portable technique the left lung appears are clear. No other No acute osseous abnormality identified. IMPRESSION: 1. Displaced right lateral 5th rib fracture with nearby confluent, multifocal right lower lung opacity. Differential considerations include  pulmonary contusions, aspiration, and less likely nontraumatic etiology such as pneumonia or mass. No definite pneumothorax or hemothorax on this supine view. 2. Mediastinal contours and the left lung appear negative. Electronically Signed   By: Genevie Ann M.D.   On: 11/06/21 07:47   CT CHEST ABDOMEN PELVIS WO CONTRAST  Result Date: 11-06-2021 CLINICAL DATA:  81 year old male status post fall. Level 1 trauma. Recently diagnosed with "metastatic cancer" . Renal insufficiency precludes IV contrast. EXAM: CT CHEST, ABDOMEN AND PELVIS WITHOUT CONTRAST TECHNIQUE: Multidetector CT imaging of the chest, abdomen and pelvis was performed following the standard protocol without IV contrast. RADIATION DOSE REDUCTION: This exam was performed according to the departmental dose-optimization program which includes automated exposure control, adjustment of the mA and/or kV according to patient size and/or use of iterative reconstruction technique. COMPARISON:  Plain films and cervical spine CT today. Outside lumbar MRI 02/11/2018. FINDINGS: CT CHEST FINDINGS Cardiovascular: Cardiac size within normal limits. No pericardial effusion. Calcified aortic atherosclerosis. Calcified coronary artery atherosclerosis and/or stents. Vascular patency is not evaluated in the absence of IV contrast. No evidence of periaortic hematoma on this noncontrast exam. Mediastinum/Nodes: Bulky mediastinal lymphadenopathy and/or ex nodal tumor (precarinal series 3, image 23 up to 6.5 cm long axis). Right hilum is abnormal. Left hilum appears to remain normal. No areas suspicious for mediastinal hematoma. Lungs/Pleura: Intubated. Endotracheal tube tip in good position above the carina. Left lung centrilobular emphysema with mild dependent and lung base patchy opacity favored to be atelectasis. Opacified bronchus intermedius and right hilar airways. Widespread airspace opacification throughout the right lung. Intermittent air bronchograms. Superimposed  small and probably loculated low-density pleural effusion most visible in the apex. No superimposed pneumothorax. Musculoskeletal: Chronic T12 augmented compression fracture, stable since 2019. Visible shoulder osseous structures appear intact. Intact sternum. No acute left rib fracture identified, although suspicion of healed chronic posterior 4th through 6th left rib fractures. Displaced right lateral 5th rib fracture, nearly 1 full shaft width displacement. Superimposed nondisplaced right anterolateral 4th and 6th rib fractures. CT ABDOMEN PELVIS FINDINGS Hepatobiliary: Streak artifact, but there are several indistinct low-density areas in the liver including at the dome on series 3, image 46 suspicious for hepatic metastatic disease in this setting. Small volume simple fluid density perihepatic ascites. Grossly negative gallbladder. Pancreas: Negative. Spleen: Small volume low-density perisplenic  fluid. Calcified splenic granulomas. Adrenals/Urinary Tract: Abnormally thickened bilateral adrenal glands. Bilateral adrenal metastases not excluded. No evidence of renal or adrenal trauma. Bilateral renal artery calcification and/or nephrolithiasis. No hydronephrosis or hydroureter. Within the bladder there are 1 or more small dependent stones on the right near the right UVJ, 4-5 mm. The distal ureter is decompressed. Stomach/Bowel: Small volume low-density ascites. No free air. Stomach and duodenum are within normal limits. But there is widespread abnormal appearance of the greater omentum which appears nodular and thickened. No superimposed dilated small or large bowel. Small bowel loop detail is obscured in many areas by the omental tumor. Vascular/Lymphatic: Extensive Aortoiliac calcified atherosclerosis. Vascular patency is not evaluated in the absence of IV contrast. No discrete lymphadenopathy in the absence of IV contrast. Reproductive: Prostatomegaly. Other: Trace pelvic ascites with simple fluid density.  Pelvic phleboliths. Musculoskeletal: Normal lumbar segmentation. Chronic L3 superior endplate Schmorl's nodes. No acute or suspicious osseous finding in the lumbar spine, pelvis or proximal femurs. IMPRESSION: TRAUMA FINDINGS 1. Acute fractures of the right 5th, 4th, and 6th ribs. No pneumothorax. No evidence of hemothorax. 2. Severe right lung and mediastinal abnormality, although appears nontraumatic (see #1 below). 3. Endotracheal tube in good position. 4. No other acute traumatic injury identified in the non-contrast chest, abdomen, or pelvis. NONTRAUMATIC FINDINGS 1. Bulky and malignant mediastinal lymphadenopathy with subtotal opacification of the right lung. Obstructing right hilar tumor suspected on this noncontrast exam. Lobar airspace disease throughout the right lung could be postobstructive pneumonia, aspiration. Small transudate pleural effusion. Underlying Emphysema (ICD10-J43.9). 2. Strong evidence of omental metastatic disease in the abdomen with small volume ascites. 3. Appearance suspicious for hepatic and adrenal metastases on this noncontrast exam. 4. Nonobstructed kidneys but positive for nephrolithiasis, small bladder stones. Benign chronic T12 compression fracture previously augmented. Extensive Aortic Atherosclerosis (ICD10-I70.0). Study reviewed in person with Dr. Georganna Skeans on 11-02-2021 at 0830 hours. Electronically Signed   By: Genevie Ann M.D.   On: 11/02/21 08:52   CT MAXILLOFACIAL WO CONTRAST  Result Date: 11/02/2021 CLINICAL DATA:  81 year old male status post fall. Level 1 trauma. Epistaxis. EXAM: CT MAXILLOFACIAL WITHOUT CONTRAST TECHNIQUE: Multidetector CT imaging of the maxillofacial structures was performed. Multiplanar CT image reconstructions were also generated. RADIATION DOSE REDUCTION: This exam was performed according to the departmental dose-optimization program which includes automated exposure control, adjustment of the mA and/or kV according to patient size and/or  use of iterative reconstruction technique. COMPARISON:  CT head and cervical spine today. FINDINGS: Osseous: Mandible intact and normally located. No acute dental finding. No maxilla, pterygoid, zygoma, or nasal bone fracture. Central skull base appears intact. Orbits: No orbital wall fracture. Globes and intraorbital soft tissues appear normal. Sinuses: Small volume layering fluid in the right sphenoid sinus. Bubbly opacity in the bilateral nasal cavity with mild nasal septal deviation which is age indeterminate. But no nasal septal hematoma. Other paranasal sinuses are clear. Tympanic cavities and mastoids are clear. Soft tissues: Intubated, small volume fluid in the pharynx. Calcified carotid atherosclerosis greater on the right. Otherwise negative visible noncontrast deep soft tissue spaces of the face. Limited intracranial: Reported separately today. IMPRESSION: No acute facial fracture identified. Retained secretions in the nasal cavity may correspond to epistaxis. And nasal septal deviation is age indeterminate but there is no nasal bone fracture. Electronically Signed   By: Genevie Ann M.D.   On: 11/02/2021 08:35    Procedures Date/Time: 11-02-21 8:16 AM Performed by: Pattricia Boss, MD Pre-anesthesia Checklist: Patient identified, Emergency Drugs  available, Suction available, Patient being monitored and Timeout performed Oxygen Delivery Method: Non-rebreather mask Preoxygenation: Pre-oxygenation with 100% oxygen Induction Type: Rapid sequence Ventilation: Mask ventilation without difficulty Laryngoscope Size: Glidescope and 4 Tube size: 7.5 mm Number of attempts: 1 Airway Equipment and Method: Patient positioned with wedge pillow and Video-laryngoscopy Placement Confirmation: ETT inserted through vocal cords under direct vision, Positive ETCO2, CO2 detector and Breath sounds checked- equal and bilateral Tube secured with: ETT holder Dental Injury: Teeth and Oropharynx as per pre-operative  assessment        Medications Ordered in ED Medications  ceFEPIme (MAXIPIME) 2 g in sodium chloride 0.9 % 100 mL IVPB (has no administration in time range)  vancomycin (VANCOREADY) IVPB 1500 mg/300 mL (1,500 mg Intravenous New Bag/Given 11-05-2021 0857)  metroNIDAZOLE (FLAGYL) IVPB 500 mg (has no administration in time range)  lactated ringers infusion (1,000 mLs Intravenous New Bag/Given 2021/11/05 0715)  etomidate (AMIDATE) injection (5 mg Intravenous Given 2021-11-05 0752)  etomidate (AMIDATE) injection (5 mg Intravenous Given November 05, 2021 0753)  succinylcholine (ANECTINE) injection (120 mg Intravenous Given 2021/11/05 0754)  potassium chloride 10 mEq in 100 mL IVPB (has no administration in time range)  diltiazem (CARDIZEM) 125 mg in dextrose 5% 125 mL (1 mg/mL) infusion (5 mg/hr Intravenous New Bag/Given November 05, 2021 0759)  fentaNYL 2560mcg in NS 235mL (99mcg/ml) infusion-PREMIX (50 mcg/hr Intravenous New Bag/Given 2021-11-05 0801)  fentaNYL (SUBLIMAZE) bolus via infusion 25-100 mcg (has no administration in time range)  midazolam (VERSED) 100 mg/100 mL (1 mg/mL) premix infusion (has no administration in time range)  midazolam (VERSED) bolus via infusion 2 mg (has no administration in time range)  diltiazem (CARDIZEM) injection 5 mg (5 mg Intravenous Given 05-Nov-2021 0758)    ED Course/ Medical Decision Making/ A&P Clinical Course as of Nov 05, 2021 0921  Thu 11-05-2021 I-STAT 8 reviewed with normal hemoglobin, Creatinine increased to 2.4 Potassium low at 2.9 Run of potassium ordered [DR]  0842 Initial chest x-Smokey Melott reviewed at bedside with large right-sided density Radiologist interpretation displaced right lateral fifth rib fracture with nearly confluent multifocal right lower lung opacities which would include pulmonary contusions, aspiration or nontraumatic etiologies  [DR]  0843 Plain pelvis x-Eric Hoffman reviewed and interpreted bed without acute abnormalities Radiologist interpretation no acute fracture or  dislocation [DR]  0847 Cervical spine CT shows no acute traumatic injury [DR]  0848 CT maxillofacial no acute facial fracture identified [DR]  0848 CT head significant for left posterior scalp hematoma no underlying fracture no acute traumatic brain injury identified [DR]  0850 CBC reviewed with leukopenia noted with white blood cell count of 1500-we will add differential Lactic acid elevated at 7.4 [DR]  0914 CT chest abdomen pelvis reviewed and noted to have acute fractures of the right fourth fifth and sixth ribs without pneumothorax Severe right lung and mediastinal abnormality with likely bulky malignant adenopathy and subtotal opacification of the right lung with probable obstructing right hilar tumor [DR]  0915 9:15 AM Adequate care at bedside and has assumed care [DR]    Clinical Course User Index [DR] Pattricia Boss, MD                           Medical Decision Making 81 year old male presents as a level 1 trauma with report of fall Airway has some dried blood he is initially appears to be maintaining his airway Breathing is tachypneic and initial sats on nonrebreather are in the low 90s Circulation patient is  extremely tachycardic anywhere from 120s to 200s EMS reports that they cardioverted him and route and he was in a sinus rhythm but has reverted to this rapid rate Patient is awake and able to answer some questions but appears decreased mental status  Bedside chest x-Smaran Gaus obtained with filtrates noted throughout right lung field Trauma surgery present at bedside with me-both Dr. Grandville Silos and Dr. Bobbye Morton IV access obtained by Dr. Grandville Silos and right femoral vein 1-Patient was as level 1 trauma.  Patient did fall and is being worked up as trauma. Results of trauma work-up revealed multiple rib fractures on right 2-possible sepsis additionally patient has infiltrate on chest x-Eric Hoffman which will or defined on CT scan.  Awaiting CT results Due to possible infiltrate with hypotension and  tachycardia, broad-spectrum antibiotics are ordered.  Patient has lactic acid pending.  Blood cultures have been ordered. Patient has received over 2 L of IV fluids at this time and third liter is infusing. 3 patient appears somewhat confused this could be secondary to 1 or 2, however other acute metabolic abnormalities in differential include liver failure, toxic syndrome, other  intracerebral abnormalities or mets 4-patient with intermittent rapid supraventricular rate suspect A-fib.  Patient has received IV fluids and low-dose Cardizem with a bolus of 5 and rate running at 5.  When patient is calm, heart rate appears to be a sinus tachycardia of about 115 blood pressure has improved to 129/73 5 patient with hypokalemia 2.9 i-STAT potassium run ordered 6-creatinine elevated-will need to be followed, 7 patient with new diagnosis of cancer within the last week, with reported liver mets, new findings on CT today reveal obstructing right hilar tumor with mediastinal adenopathy adenopathy  Decision made to intubate when patient has been somewhat hypoxic on nonrebreather, has increased work of breathing, and has obvious infiltrate on chest x-Eric Hoffman Secondary survey reveals no obvious external trauma to head but blood as noted in a physical exam Neck with cervical collar in place no obvious signs of trauma Lungs continue had decreased breath sounds on right See physical exam for complete secondary survey Patient had Cardizem drip started as EKG appears to be most consistent with A-fib with a rapid ventricular response Initially started with 5 mg bolus and at 7.5/h   Amount and/or Complexity of Data Reviewed Independent Historian: spouse    Details: Dr. Bobbye Morton Discussed with wife via phone.  She reports that he has had a recent diagnosis of cancer within the past week.  She reports that at this time he is full code Discussed with wife and who is now in family room.  She states that he fell twice last  night.  The first time he fell he did not hurt himself and was able to pull himself back up on the couch.  He got up around 1230 and on his way to the bathroom fell and struck his face.  She heard him fall and went to him.  She does not think there was any loss of consciousness.  He was insistent that he would be able to get himself up.  She sat with him on the floor during the night and called EMS when he was unable to get up this morning. She states that he was reviewed recently diagnosed with liver cancer.  His primary care provider noted that he had fullness in his right upper quadrant and ordered an MRI.  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  She does not  report that they noted anything about his lungs on the exam or any masses noted in his lung fields.  She did state that he has had some coughing over the past day to week.  His appetite has been poor but has been poor for several months.  A External Data Reviewed: notes.    Details: Reviewed out of hospital note from merged charts that states that he had MRI performed with liver mass and periumbilical mass. Labs: ordered. Decision-making details documented in ED Course. Radiology: ordered and independent interpretation performed. Decision-making details documented in ED Course. ECG/medicine tests: ordered and independent interpretation performed. Decision-making details documented in ED Course.    Details: Patient on cardiac monitor with sinus tachycardia and episodes of rapid rate of 1 50-200 unclear rhythm Discussion of management or test interpretation with external provider(s): Discussed patient care with Drs. Bobbye Morton and Grandville Silos Discussed results of CTs with Dr. Grandville Silos Discussed care with critical care, Burman Nieves, NP Discussed with Richardson Landry at bedside.  Patient's heart rate 114 blood pressure has been as low as 79 on recheck while in the room increased to 93.  He is currently on Versed and  fentanyl and are holding the Versed drip.  The Cardizem is at 5 and his heart rate has any sinus tachycardia.  He does still still intermittently go into a rapid rate with some QRS widening. Potassium hanging Patient has received 3 L of fluid at this time and fourth is to be started Fentanyl drip is hanging for sedation, Versed is hanging and is being held at this time Vancomycin is ready to infuse Cardizem is at 5/h Multiple considerations given whether or not to add pressors at this point.  Patient intermittently has extreme tachycardia.  This appears to be controlled with the Cardizem and sedation.  He additionally has received 3 L of IV fluids at this time.  Concerned that patient will become very tachycardic again with pressors.  Will need close monitoring of blood pressure  Risk Prescription drug management. Parenteral controlled substances. Decision regarding hospitalization.  Critical Care Total time providing critical care: 75-105 minutes          Final Clinical Impression(s) / ED Diagnoses Final diagnoses:  Trauma  Fall, initial encounter  Traumatic ecchymosis of face, initial encounter  Lung mass  Postobstructive pneumonia  Closed fracture of multiple ribs of right side, initial encounter  SVT (supraventricular tachycardia) (HCC)  Hypokalemia  Elevated serum creatinine  Sepsis with acute organ dysfunction and septic shock, due to unspecified organism, unspecified type [facility])    Rx / DC Orders ED Discharge Orders     None         Pattricia Boss, MD 2021-10-30 0920    Pattricia Boss, MD 2021-10-30 [phone]

## 2021-11-22 NOTE — Consult Note (Signed)
Urology Consult   Physician requesting consult: Pattricia Boss  Reason for consult: Difficult foley placement  History of Present Illness: Eric Hoffman is a 81 y.o. male with PMH significant for HTN, depression, and anxiety who was brought to the ED at [facility] after multiple falls at home.  He was recently diagnosed with liver cancer which is pending further workup. He was found to be tachypnic, hypotensive, and tachycardic with altered mental status.  He was cardioverted twice. He is currently intubated and stable in the [facility] ED.    RN attempted to place a 14 and 16 french foley (4 total attempts) which were unsuccessful to due urethral obstruction.  Urology consult was requested.   Pt is intubated and no family is present to obtain any additional information.   Past Medical History:  Diagnosis Date   Hypertension      Current Hospital Medications:  Home Meds:  . No outpatient medications have been marked as taking for the 2021-11-04 encounter [facility] Encounter).     Scheduled Meds:  pantoprazole (PROTONIX) IV  40 mg Intravenous QHS   vancomycin variable dose per unstable renal function (pharmacist dosing)   Does not apply See admin instructions   Continuous Infusions:  sodium chloride 150 mL/hr at 2021-11-04 0942   sodium chloride     ceFEPime (MAXIPIME) IV     [START ON 10/24/2021] ceFEPime (MAXIPIME) IV     diltiazem (CARDIZEM) infusion 5 mg/hr (2021-11-04 1048)   fentaNYL infusion INTRAVENOUS 75 mcg/hr (2021-11-04 0949)   metronidazole     midazolam 1 mg/hr (11/04/2021 0946)   phenylephrine (NEO-SYNEPHRINE) Adult infusion 40 mcg/min (11-04-2021 1050)   potassium chloride 10 mEq (11/04/21 1005)   PRN Meds:.docusate sodium, fentaNYL, polyethylene glycol  Allergies:  Allergies  Allergen Reactions   Benadryl [Diphenhydramine] Other (See Comments)    Hyperactivity,  Difficulty breathing, difficulty swallowing.  Pt denies allergy.   Effexor [Venlafaxine] Swelling   Lariam  [Mefloquine] Other (See Comments)    Suicidal ideation   Statins Other (See Comments)    Muscle aches    No family history on file.  Social History:  reports that he has been smoking cigarettes. He has never used smokeless tobacco. He reports that he does not currently use alcohol. He reports that he does not currently use drugs.  ROS: A complete review of systems was not performed because pt is sedated and intubated.    Physical Exam:  Vital signs in last 24 hours: Temp:  [98.5 F (36.9 C)] 98.5 F (36.9 C) (03/02 0713) Pulse Rate:  [30-191] 62 (03/02 1048) Resp:  [12-34] 22 (03/02 1048) BP: (72-129)/(46-97) 91/64 (03/02 1048) SpO2:  [87 %-99 %] 96 % (03/02 1048) FiO2 (%):  [100 %] 100 % (03/02 0836) Weight:  [76.2 kg] 76.2 kg (03/02 0733) Constitutional:  intubated Cardiovascular: tachy Respiratory: intubated; clear BS bases GI: Abdomen is soft, mild firmness/distention over bladder GU: circum penis with no discharge or skin breakdown Lymphatic: No lymphadenopathy Neurologic: unable to assess Psychiatric: unable to assess  Laboratory Data:  Recent Labs    11-04-2021 0737 2021-11-04 0745 11-04-21 0942  WBC  --  1.5*  --   HGB 13.9 14.0 10.9*  HCT 41.0 43.3 32.0*  PLT  --  73*  --     Recent Labs    11/04/21 0737 11/04/21 0745 11-04-2021 0942  NA 133* 136 137  K 2.9* 3.2* 2.9*  CL 92* 92*  --   GLUCOSE 90 92  --  BUN 33* 36*  --   CALCIUM  --  8.1*  --   CREATININE 2.40* 2.48*  --      Results for orders placed or performed during the hospital encounter of 10/28/2021 (from the past 24 hour(s))  CBG monitoring, ED     Status: None   Collection Time: 28-Oct-2021  7:20 AM  Result Value Ref Range   Glucose-Capillary 83 70 - 99 mg/dL  Resp Panel by RT-PCR (Flu A&B, Covid) Nasopharyngeal Swab     Status: None   Collection Time: 2021/10/28  7:23 AM   Specimen: Nasopharyngeal Swab; Nasopharyngeal(NP) swabs in vial transport medium  Result Value Ref Range   SARS  Coronavirus 2 by RT PCR NEGATIVE NEGATIVE   Influenza A by PCR NEGATIVE NEGATIVE   Influenza B by PCR NEGATIVE NEGATIVE  Sample to Blood Bank     Status: None   Collection Time: 10/28/2021  7:32 AM  Result Value Ref Range   Blood Bank Specimen SAMPLE AVAILABLE FOR TESTING    Sample Expiration      11/06/2021,2359 Performed at [facility], [address], [place], [place] [address]   I-Stat Chem 8, ED     Status: Abnormal   Collection Time: 10/28/2021  7:37 AM  Result Value Ref Range   Sodium 133 (L) 135 - 145 mmol/L   Potassium 2.9 (L) 3.5 - 5.1 mmol/L   Chloride 92 (L) 98 - 111 mmol/L   BUN 33 (H) 8 - 23 mg/dL   Creatinine, Ser 2.40 (H) 0.61 - 1.24 mg/dL   Glucose, Bld 90 70 - 99 mg/dL   Calcium, Ion 0.93 (L) 1.15 - 1.40 mmol/L   TCO2 29 22 - 32 mmol/L   Hemoglobin 13.9 13.0 - 17.0 g/dL   HCT 41.0 39.0 - 52.0 %  Comprehensive metabolic panel     Status: Abnormal   Collection Time: Oct 28, 2021  7:45 AM  Result Value Ref Range   Sodium 136 135 - 145 mmol/L   Potassium 3.2 (L) 3.5 - 5.1 mmol/L   Chloride 92 (L) 98 - 111 mmol/L   CO2 23 22 - 32 mmol/L   Glucose, Bld 92 70 - 99 mg/dL   BUN 36 (H) 8 - 23 mg/dL   Creatinine, Ser 2.48 (H) 0.61 - 1.24 mg/dL   Calcium 8.1 (L) 8.9 - 10.3 mg/dL   Total Protein 4.8 (L) 6.5 - 8.1 g/dL   Albumin 1.9 (L) 3.5 - 5.0 g/dL   AST 56 (H) 15 - 41 U/L   ALT 22 0 - 44 U/L   Alkaline Phosphatase 53 38 - 126 U/L   Total Bilirubin 0.9 0.3 - 1.2 mg/dL   GFR, Estimated 26 (L) >60 mL/min   Anion gap 21 (H) 5 - 15  CBC     Status: Abnormal   Collection Time: 10/28/2021  7:45 AM  Result Value Ref Range   WBC 1.5 (L) 4.0 - 10.5 K/uL   RBC 4.98 4.22 - 5.81 MIL/uL   Hemoglobin 14.0 13.0 - 17.0 g/dL   HCT 43.3 39.0 - 52.0 %   MCV 86.9 80.0 - 100.0 fL   MCH 28.1 26.0 - 34.0 pg   MCHC 32.3 30.0 - 36.0 g/dL   RDW 14.9 11.5 - 15.5 %   Platelets 73 (L) 150 - 400 K/uL   nRBC 1.4 (H) 0.0 - 0.2 %  Ethanol     Status: None   Collection Time: Oct 28, 2021  7:45  AM  Result  Value Ref Range   Alcohol, Ethyl (B) <10 <10 mg/dL  Protime-INR     Status: Abnormal   Collection Time: 2021/10/27  7:45 AM  Result Value Ref Range   Prothrombin Time 15.6 (H) 11.4 - 15.2 seconds   INR 1.2 0.8 - 1.2  Lactic acid, plasma     Status: Abnormal   Collection Time: Oct 27, 2021  7:47 AM  Result Value Ref Range   Lactic Acid, Venous 7.4 (HH) 0.5 - 1.9 mmol/L  Lactic acid, plasma     Status: Abnormal   Collection Time: October 27, 2021  9:33 AM  Result Value Ref Range   Lactic Acid, Venous 5.7 (HH) 0.5 - 1.9 mmol/L  I-Stat arterial blood gas, ED     Status: Abnormal   Collection Time: 10-27-2021  9:42 AM  Result Value Ref Range   pH, Arterial 7.402 7.35 - 7.45   pCO2 arterial 41.6 32 - 48 mmHg   pO2, Arterial 72 (L) 83 - 108 mmHg   Bicarbonate 25.9 20.0 - 28.0 mmol/L   TCO2 27 22 - 32 mmol/L   O2 Saturation 94 %   Acid-Base Excess 1.0 0.0 - 2.0 mmol/L   Sodium 137 135 - 145 mmol/L   Potassium 2.9 (L) 3.5 - 5.1 mmol/L   Calcium, Ion 1.02 (L) 1.15 - 1.40 mmol/L   HCT 32.0 (L) 39.0 - 52.0 %   Hemoglobin 10.9 (L) 13.0 - 17.0 g/dL   Sample type ARTERIAL    Recent Results (from the past 240 hour(s))  Resp Panel by RT-PCR (Flu A&B, Covid) Nasopharyngeal Swab     Status: None   Collection Time: 10/27/21  7:23 AM   Specimen: Nasopharyngeal Swab; Nasopharyngeal(NP) swabs in vial transport medium  Result Value Ref Range Status   SARS Coronavirus 2 by RT PCR NEGATIVE NEGATIVE Final    Comment: (NOTE) SARS-CoV-2 target nucleic acids are NOT DETECTED.  The SARS-CoV-2 RNA is generally detectable in upper respiratory specimens during the acute phase of infection. The lowest concentration of SARS-CoV-2 viral copies this assay can detect is 138 copies/mL. A negative result does not preclude SARS-Cov-2 infection and should not be used as the sole basis for treatment or other patient management decisions. A negative result may occur with  improper specimen collection/handling,  submission of specimen other than nasopharyngeal swab, presence of viral mutation(s) within the areas targeted by this assay, and inadequate number of viral copies(<138 copies/mL). A negative result must be combined with clinical observations, patient history, and epidemiological information. The expected result is Negative.  Fact Sheet for Patients:  EntrepreneurPulse.com.au  Fact Sheet for Healthcare Providers:  IncredibleEmployment.be  This test is no t yet approved or cleared by the [place] FDA and  has been authorized for detection and/or diagnosis of SARS-CoV-2 by FDA under an Emergency Use Authorization (EUA). This EUA will remain  in effect (meaning this test can be used) for the duration of the COVID-19 declaration under Section 564(b)(1) of the Act, 21 U.S.C.section 360bbb-3(b)(1), unless the authorization is terminated  or revoked sooner.       Influenza A by PCR NEGATIVE NEGATIVE Final   Influenza B by PCR NEGATIVE NEGATIVE Final    Comment: (NOTE) The Xpert Xpress SARS-CoV-2/FLU/RSV plus assay is intended as an aid in the diagnosis of influenza from Nasopharyngeal swab specimens and should not be used as a sole basis for treatment. Nasal washings and aspirates are unacceptable for Xpert Xpress SARS-CoV-2/FLU/RSV testing.  Fact Sheet for Patients: EntrepreneurPulse.com.au  Fact Sheet for  Healthcare Providers: IncredibleEmployment.be  This test is not yet approved or cleared by the [place] and has been authorized for detection and/or diagnosis of SARS-CoV-2 by FDA under an Emergency Use Authorization (EUA). This EUA will remain in effect (meaning this test can be used) for the duration of the COVID-19 declaration under Section 564(b)(1) of the Act, 21 U.S.C. section 360bbb-3(b)(1), unless the authorization is terminated or revoked.  Performed at [facility], [place] [address] [place], [place] [address]     Renal Function: Recent Labs    Oct 24, 2021 2202 10/24/21 0745  CREATININE 2.40* 2.48*   Estimated Creatinine Clearance: 25.3 mL/min (A) (by C-G formula based on SCr of 2.48 mg/dL (H)).  Radiologic Imaging: CT HEAD WO CONTRAST  Result Date: 2021-10-24 CLINICAL DATA:  81 year old male status post fall. Level 1 trauma. EXAM: CT HEAD WITHOUT CONTRAST TECHNIQUE: Contiguous axial images were obtained from the base of the skull through the vertex without intravenous contrast. RADIATION DOSE REDUCTION: This exam was performed according to the departmental dose-optimization program which includes automated exposure control, adjustment of the mA and/or kV according to patient size and/or use of iterative reconstruction technique. COMPARISON:  Face and cervical spine CT today. FINDINGS: Brain: Incidental dural calcification along the falx. Chronic appearing bilateral cerebellar infarcts. Chronic encephalomalacia in the right occipital pole, right PCA territory. Small area of chronic encephalomalacia also in the posterosuperior right parietal lobe (series 3, image 25). No midline shift, ventriculomegaly, mass effect, evidence of mass lesion, intracranial hemorrhage or evidence of cortically based acute infarction. Vascular: Extensive Calcified atherosclerosis at the skull base. Skull: No fracture identified. Sinuses/Orbits: Low-density bubbly opacity in the right sphenoid sinus. Other Visualized paranasal sinuses and mastoids are clear. Other: Left posterior scalp hematoma or contusion on series 4, image 58. No scalp soft tissue gas. Underlying calvarium appears intact. Other orbit and scalp soft tissues appear negative. IMPRESSION: 1. Left posterior scalp hematoma or contusion without underlying skull fracture. 2. No acute traumatic injury to the brain identified. 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 3. Mild right sphenoid sinus  inflammation. Electronically Signed   By: Genevie Ann M.D.   On: 2021-10-24 08:21   CT CERVICAL SPINE WO CONTRAST  Result Date: 10-24-2021 CLINICAL DATA:  81 year old male status post fall. Level 1 trauma. Recently diagnosed with "metastatic cancer". EXAM: CT CERVICAL SPINE WITHOUT CONTRAST TECHNIQUE: Multidetector CT imaging of the cervical spine was performed without intravenous contrast. Multiplanar CT image reconstructions were also generated. RADIATION DOSE REDUCTION: This exam was performed according to the departmental dose-optimization program which includes automated exposure control, adjustment of the mA and/or kV according to patient size and/or use of iterative reconstruction technique. COMPARISON:  CT head and face today reported separately. FINDINGS: Alignment: Preserved cervical lordosis. Cervicothoracic junction alignment is within normal limits. Bilateral posterior element alignment is within normal limits. Skull base and vertebrae: Visualized skull base is intact. No atlanto-occipital dissociation. C1 and C2 appear intact and aligned. No acute osseous abnormality identified. Soft tissues and spinal canal: No prevertebral fluid or swelling. No visible canal hematoma. Intubated. Endotracheal tube courses appropriately into the trachea. Small volume retained secretions in the pharynx. Right greater than left cervical carotid artery atherosclerosis. No discrete soft tissue injury identified in the neck. Disc levels: Age-appropriate cervical spine degeneration. No spinal stenosis suspected. Upper chest: Abnormal. Partially visible malignant-appearing mediastinal lymphadenopathy. Low-density pleural effusion and extensive airspace disease in the right lung apex. Grossly intact visible upper  thoracic levels. Contralateral left upper lobe centrilobular emphysema. IMPRESSION: 1. No acute traumatic injury identified in the cervical spine. 2. Abnormal upper chest - see CT Chest, Abdomen, and Pelvis today  reported separately. Electronically Signed   By: Genevie Ann M.D.   On: 11/08/2021 08:39   DG Pelvis Portable  Result Date: 08-Nov-2021 CLINICAL DATA:  81 year old male status post fall.  Level 1 trauma. EXAM: PORTABLE PELVIS 1-2 VIEWS COMPARISON:  None. FINDINGS: Portable AP supine view at 0737 hours. Femoral heads are normally located. Inferior pubic rami not included, but otherwise the visible pelvis appears intact. Grossly intact proximal femurs. Pubic symphysis and SI joints appear within normal limits. Negative visible bowel gas pattern. Pelvic phleboliths. IMPRESSION: No acute fracture or dislocation identified about the visible pelvis. Electronically Signed   By: Genevie Ann M.D.   On: 11/08/21 07:44   DG Chest Portable 1 View  Result Date: Nov 08, 2021 CLINICAL DATA:  81 year old male status post fall. Level 1 trauma. Recently diagnosed with "metastatic cancer". EXAM: PORTABLE CHEST 1 VIEW COMPARISON:  Chest CT 0817 hours today.  Portable chest 0734 hours. FINDINGS: Portable AP semi upright view at 0843 hours. Intubated. Endotracheal tube tip in good position just below the clavicles. Slightly lower right lung volume. Extensive right lung opacification, see chest CT reported separately. No pneumothorax. Left lung appears stable and negative. Mildly displaced right 5th rib fracture redemonstrated. IMPRESSION: 1. Endotracheal tube tip in good position. 2. Extensive right lung opacification, see Chest CT reported separately. 3. Right 5th rib fracture. Electronically Signed   By: Genevie Ann M.D.   On: Nov 08, 2021 08:54   DG Chest Port 1 View  Result Date: 2021-11-08 CLINICAL DATA:  81 year old male status post fall. Level 1 trauma. EXAM: PORTABLE CHEST 1 VIEW COMPARISON:  None. FINDINGS: Portable AP supine view at 0734 hours. Moderate to large confluent opacity throughout the right lower lung, most pronounced at and inferior to the hilum but there is also patchy right upper lobe opacity. Displaced right lateral 5th  rib fracture. No pneumothorax is evident. The right costophrenic angle is aerated, arguing against a right pleural effusion. Normal cardiac size and mediastinal contours. Visualized tracheal air column is within normal limits. Allowing for portable technique the left lung appears are clear. No other No acute osseous abnormality identified. IMPRESSION: 1. Displaced right lateral 5th rib fracture with nearby confluent, multifocal right lower lung opacity. Differential considerations include pulmonary contusions, aspiration, and less likely nontraumatic etiology such as pneumonia or mass. No definite pneumothorax or hemothorax on this supine view. 2. Mediastinal contours and the left lung appear negative. Electronically Signed   By: Genevie Ann M.D.   On: 2021/11/08 07:47   CT CHEST ABDOMEN PELVIS WO CONTRAST  Result Date: Nov 08, 2021 CLINICAL DATA:  81 year old male status post fall. Level 1 trauma. Recently diagnosed with "metastatic cancer" . Renal insufficiency precludes IV contrast. EXAM: CT CHEST, ABDOMEN AND PELVIS WITHOUT CONTRAST TECHNIQUE: Multidetector CT imaging of the chest, abdomen and pelvis was performed following the standard protocol without IV contrast. RADIATION DOSE REDUCTION: This exam was performed according to the departmental dose-optimization program which includes automated exposure control, adjustment of the mA and/or kV according to patient size and/or use of iterative reconstruction technique. COMPARISON:  Plain films and cervical spine CT today. Outside lumbar MRI 02/11/2018. FINDINGS: CT CHEST FINDINGS Cardiovascular: Cardiac size within normal limits. No pericardial effusion. Calcified aortic atherosclerosis. Calcified coronary artery atherosclerosis and/or stents. Vascular patency is not evaluated in the absence of IV  contrast. No evidence of periaortic hematoma on this noncontrast exam. Mediastinum/Nodes: Bulky mediastinal lymphadenopathy and/or ex nodal tumor (precarinal series 3,  image 23 up to 6.5 cm long axis). Right hilum is abnormal. Left hilum appears to remain normal. No areas suspicious for mediastinal hematoma. Lungs/Pleura: Intubated. Endotracheal tube tip in good position above the carina. Left lung centrilobular emphysema with mild dependent and lung base patchy opacity favored to be atelectasis. Opacified bronchus intermedius and right hilar airways. Widespread airspace opacification throughout the right lung. Intermittent air bronchograms. Superimposed small and probably loculated low-density pleural effusion most visible in the apex. No superimposed pneumothorax. Musculoskeletal: Chronic T12 augmented compression fracture, stable since 2019. Visible shoulder osseous structures appear intact. Intact sternum. No acute left rib fracture identified, although suspicion of healed chronic posterior 4th through 6th left rib fractures. Displaced right lateral 5th rib fracture, nearly 1 full shaft width displacement. Superimposed nondisplaced right anterolateral 4th and 6th rib fractures. CT ABDOMEN PELVIS FINDINGS Hepatobiliary: Streak artifact, but there are several indistinct low-density areas in the liver including at the dome on series 3, image 46 suspicious for hepatic metastatic disease in this setting. Small volume simple fluid density perihepatic ascites. Grossly negative gallbladder. Pancreas: Negative. Spleen: Small volume low-density perisplenic fluid. Calcified splenic granulomas. Adrenals/Urinary Tract: Abnormally thickened bilateral adrenal glands. Bilateral adrenal metastases not excluded. No evidence of renal or adrenal trauma. Bilateral renal artery calcification and/or nephrolithiasis. No hydronephrosis or hydroureter. Within the bladder there are 1 or more small dependent stones on the right near the right UVJ, 4-5 mm. The distal ureter is decompressed. Stomach/Bowel: Small volume low-density ascites. No free air. Stomach and duodenum are within normal limits. But  there is widespread abnormal appearance of the greater omentum which appears nodular and thickened. No superimposed dilated small or large bowel. Small bowel loop detail is obscured in many areas by the omental tumor. Vascular/Lymphatic: Extensive Aortoiliac calcified atherosclerosis. Vascular patency is not evaluated in the absence of IV contrast. No discrete lymphadenopathy in the absence of IV contrast. Reproductive: Prostatomegaly. Other: Trace pelvic ascites with simple fluid density. Pelvic phleboliths. Musculoskeletal: Normal lumbar segmentation. Chronic L3 superior endplate Schmorl's nodes. No acute or suspicious osseous finding in the lumbar spine, pelvis or proximal femurs. IMPRESSION: TRAUMA FINDINGS 1. Acute fractures of the right 5th, 4th, and 6th ribs. No pneumothorax. No evidence of hemothorax. 2. Severe right lung and mediastinal abnormality, although appears nontraumatic (see #1 below). 3. Endotracheal tube in good position. 4. No other acute traumatic injury identified in the non-contrast chest, abdomen, or pelvis. NONTRAUMATIC FINDINGS 1. Bulky and malignant mediastinal lymphadenopathy with subtotal opacification of the right lung. Obstructing right hilar tumor suspected on this noncontrast exam. Lobar airspace disease throughout the right lung could be postobstructive pneumonia, aspiration. Small transudate pleural effusion. Underlying Emphysema (ICD10-J43.9). 2. Strong evidence of omental metastatic disease in the abdomen with small volume ascites. 3. Appearance suspicious for hepatic and adrenal metastases on this noncontrast exam. 4. Nonobstructed kidneys but positive for nephrolithiasis, small bladder stones. Benign chronic T12 compression fracture previously augmented. Extensive Aortic Atherosclerosis (ICD10-I70.0). Study reviewed in person with Dr. Georganna Skeans on 11/12/21 at 0830 hours. Electronically Signed   By: Genevie Ann M.D.   On: 11-12-2021 08:52   CT MAXILLOFACIAL WO  CONTRAST  Result Date: 11/12/2021 CLINICAL DATA:  81 year old male status post fall. Level 1 trauma. Epistaxis. EXAM: CT MAXILLOFACIAL WITHOUT CONTRAST TECHNIQUE: Multidetector CT imaging of the maxillofacial structures was performed. Multiplanar CT image reconstructions were also generated. RADIATION DOSE  REDUCTION: This exam was performed according to the departmental dose-optimization program which includes automated exposure control, adjustment of the mA and/or kV according to patient size and/or use of iterative reconstruction technique. COMPARISON:  CT head and cervical spine today. FINDINGS: Osseous: Mandible intact and normally located. No acute dental finding. No maxilla, pterygoid, zygoma, or nasal bone fracture. Central skull base appears intact. Orbits: No orbital wall fracture. Globes and intraorbital soft tissues appear normal. Sinuses: Small volume layering fluid in the right sphenoid sinus. Bubbly opacity in the bilateral nasal cavity with mild nasal septal deviation which is age indeterminate. But no nasal septal hematoma. Other paranasal sinuses are clear. Tympanic cavities and mastoids are clear. Soft tissues: Intubated, small volume fluid in the pharynx. Calcified carotid atherosclerosis greater on the right. Otherwise negative visible noncontrast deep soft tissue spaces of the face. Limited intracranial: Reported separately today. IMPRESSION: No acute facial fracture identified. Retained secretions in the nasal cavity may correspond to epistaxis. And nasal septal deviation is age indeterminate but there is no nasal bone fracture. Electronically Signed   By: Genevie Ann M.D.   On: Nov 06, 2021 08:35    Procedure:  Area prepped and draped in sterile fashion.  Attempt to place 18 coude cath was unsuccessful due to urethral stricture proximal to meatus.    Impression/Recommendation  Urethral stricture--Dr. Junious Silk to see the pt and scope in a catheter.  He may require dilation with sounds.  Cath  cart available in ED room.   Debbrah Alar Nov 06, 2021, 11:13 AM

## 2021-11-22 NOTE — Progress Notes (Signed)
Orthopedic Tech Progress Note ?Patient Details:  ?Eric Hoffman ?02-14-1941 ?[id number] ?Level 1 Trauma. Not needed ?Patient ID: KOI ZANGARA, male   DOB: [birth year], 81 y.o.   MRN: [id number] ? ?Vayla Wilhelmi A Juriel Cid ?11-02-2021, 8:22 AM ? ?

## 2021-11-22 NOTE — Consult Note (Signed)
Reason for Consult:level 1 S/P GLF Referring Physician: [name] is an 81 y.o. male.  HPI: 30M s/p fall at home x2 in the last 24h. Refused treatment after the first fall. WIfe states he has be very angry and non-compliant after recent dx of liver masses ~1w ago at [facility] in [place]. Presumed malignancy, but wife unable to provide any additional information regarding biopsy or primary. Eric Hoffman is his PCP. She states he is full code as they have not had a chance to discuss further, only that he would "want to die at home".   Per EMS report he was with heart rate in 40s initially but accelerated to 200 bpm. Cardioverted by EMS and heart rate sustained >120 bpm while in trauma bay.  On arrival, looked like AF RVR. SBP 80s came up with fluids. Central line placed and he was intubated by the EDP.  GCS [id number]    Past Medical History:  Diagnosis Date   Hypertension     No family history on file.  Social History:  reports that he has been smoking cigarettes. He has never used smokeless tobacco. He reports that he does not currently use alcohol. He reports that he does not currently use drugs.  Allergies:  Allergies  Allergen Reactions   Benadryl [Diphenhydramine] Other (See Comments)    Hyperactivity,  Difficulty breathing, difficulty swallowing.  Pt denies allergy.   Effexor [Venlafaxine] Swelling   Lariam [Mefloquine] Other (See Comments)    Suicidal ideation   Statins Other (See Comments)    Muscle aches    Medications: I have reviewed the patient's current medications.  Results for orders placed or performed during the hospital encounter of 2021/11/13 (from the past 48 hour(s))  CBG monitoring, ED     Status: None   Collection Time: 11/13/21  7:20 AM  Result Value Ref Range   Glucose-Capillary 83 70 - 99 mg/dL    Comment: Glucose reference range applies only to samples taken after fasting for at least 8 hours.  I-Stat Chem 8,  ED     Status: Abnormal   Collection Time: November 13, 2021  7:37 AM  Result Value Ref Range   Sodium 133 (L) 135 - 145 mmol/L   Potassium 2.9 (L) 3.5 - 5.1 mmol/L   Chloride 92 (L) 98 - 111 mmol/L   BUN 33 (H) 8 - 23 mg/dL   Creatinine, Ser 2.40 (H) 0.61 - 1.24 mg/dL   Glucose, Bld 90 70 - 99 mg/dL    Comment: Glucose reference range applies only to samples taken after fasting for at least 8 hours.   Calcium, Ion 0.93 (L) 1.15 - 1.40 mmol/L   TCO2 29 22 - 32 mmol/L   Hemoglobin 13.9 13.0 - 17.0 g/dL   HCT 41.0 39.0 - 52.0 %  Protime-INR     Status: Abnormal   Collection Time: 13-Nov-2021  7:45 AM  Result Value Ref Range   Prothrombin Time 15.6 (H) 11.4 - 15.2 seconds   INR 1.2 0.8 - 1.2    Comment: (NOTE) INR goal varies based on device and disease states. Performed at [facility], [place] [address] [place], [place] [address]     CT HEAD WO CONTRAST  Result Date: 11/13/21 CLINICAL DATA:  81 year old male status post fall. Level 1 trauma. EXAM: CT HEAD WITHOUT CONTRAST TECHNIQUE: Contiguous axial images were obtained from the base of the skull through the vertex without intravenous contrast. RADIATION DOSE REDUCTION: This exam  was performed according to the departmental dose-optimization program which includes automated exposure control, adjustment of the mA and/or kV according to patient size and/or use of iterative reconstruction technique. COMPARISON:  Face and cervical spine CT today. FINDINGS: Brain: Incidental dural calcification along the falx. Chronic appearing bilateral cerebellar infarcts. Chronic encephalomalacia in the right occipital pole, right PCA territory. Small area of chronic encephalomalacia also in the posterosuperior right parietal lobe (series 3, image 25). No midline shift, ventriculomegaly, mass effect, evidence of mass lesion, intracranial hemorrhage or evidence of cortically based acute infarction. Vascular: Extensive Calcified atherosclerosis at the skull base.  Skull: No fracture identified. Sinuses/Orbits: Low-density bubbly opacity in the right sphenoid sinus. Other Visualized paranasal sinuses and mastoids are clear. Other: Left posterior scalp hematoma or contusion on series 4, image 58. No scalp soft tissue gas. Underlying calvarium appears intact. Other orbit and scalp soft tissues appear negative. IMPRESSION: 1. Left posterior scalp hematoma or contusion without underlying skull fracture. 2. No acute traumatic injury to the brain identified. 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 3. Mild right sphenoid sinus inflammation. Electronically Signed   By: Genevie Ann M.D.   On: November 01, 2021 08:21   CT CERVICAL SPINE WO CONTRAST  Result Date: 2021/11/01 CLINICAL DATA:  81 year old male status post fall. Level 1 trauma. Recently diagnosed with "metastatic cancer". EXAM: CT CERVICAL SPINE WITHOUT CONTRAST TECHNIQUE: Multidetector CT imaging of the cervical spine was performed without intravenous contrast. Multiplanar CT image reconstructions were also generated. RADIATION DOSE REDUCTION: This exam was performed according to the departmental dose-optimization program which includes automated exposure control, adjustment of the mA and/or kV according to patient size and/or use of iterative reconstruction technique. COMPARISON:  CT head and face today reported separately. FINDINGS: Alignment: Preserved cervical lordosis. Cervicothoracic junction alignment is within normal limits. Bilateral posterior element alignment is within normal limits. Skull base and vertebrae: Visualized skull base is intact. No atlanto-occipital dissociation. C1 and C2 appear intact and aligned. No acute osseous abnormality identified. Soft tissues and spinal canal: No prevertebral fluid or swelling. No visible canal hematoma. Intubated. Endotracheal tube courses appropriately into the trachea. Small volume retained secretions in the pharynx. Right greater than  left cervical carotid artery atherosclerosis. No discrete soft tissue injury identified in the neck. Disc levels: Age-appropriate cervical spine degeneration. No spinal stenosis suspected. Upper chest: Abnormal. Partially visible malignant-appearing mediastinal lymphadenopathy. Low-density pleural effusion and extensive airspace disease in the right lung apex. Grossly intact visible upper thoracic levels. Contralateral left upper lobe centrilobular emphysema. IMPRESSION: 1. No acute traumatic injury identified in the cervical spine. 2. Abnormal upper chest - see CT Chest, Abdomen, and Pelvis today reported separately. Electronically Signed   By: Genevie Ann M.D.   On: November 01, 2021 08:39   DG Pelvis Portable  Result Date: 01-Nov-2021 CLINICAL DATA:  81 year old male status post fall.  Level 1 trauma. EXAM: PORTABLE PELVIS 1-2 VIEWS COMPARISON:  None. FINDINGS: Portable AP supine view at 0737 hours. Femoral heads are normally located. Inferior pubic rami not included, but otherwise the visible pelvis appears intact. Grossly intact proximal femurs. Pubic symphysis and SI joints appear within normal limits. Negative visible bowel gas pattern. Pelvic phleboliths. IMPRESSION: No acute fracture or dislocation identified about the visible pelvis. Electronically Signed   By: Genevie Ann M.D.   On: 11/01/21 07:44   DG Chest Port 1 View  Result Date: 2021/11/01 CLINICAL DATA:  81 year old male status post fall. Level 1 trauma. EXAM: PORTABLE CHEST 1  VIEW COMPARISON:  None. FINDINGS: Portable AP supine view at 0734 hours. Moderate to large confluent opacity throughout the right lower lung, most pronounced at and inferior to the hilum but there is also patchy right upper lobe opacity. Displaced right lateral 5th rib fracture. No pneumothorax is evident. The right costophrenic angle is aerated, arguing against a right pleural effusion. Normal cardiac size and mediastinal contours. Visualized tracheal air column is within normal  limits. Allowing for portable technique the left lung appears are clear. No other No acute osseous abnormality identified. IMPRESSION: 1. Displaced right lateral 5th rib fracture with nearby confluent, multifocal right lower lung opacity. Differential considerations include pulmonary contusions, aspiration, and less likely nontraumatic etiology such as pneumonia or mass. No definite pneumothorax or hemothorax on this supine view. 2. Mediastinal contours and the left lung appear negative. Electronically Signed   By: Genevie Ann M.D.   On: 2021-10-30 07:47   CT MAXILLOFACIAL WO CONTRAST  Result Date: 10-30-21 CLINICAL DATA:  81 year old male status post fall. Level 1 trauma. Epistaxis. EXAM: CT MAXILLOFACIAL WITHOUT CONTRAST TECHNIQUE: Multidetector CT imaging of the maxillofacial structures was performed. Multiplanar CT image reconstructions were also generated. RADIATION DOSE REDUCTION: This exam was performed according to the departmental dose-optimization program which includes automated exposure control, adjustment of the mA and/or kV according to patient size and/or use of iterative reconstruction technique. COMPARISON:  CT head and cervical spine today. FINDINGS: Osseous: Mandible intact and normally located. No acute dental finding. No maxilla, pterygoid, zygoma, or nasal bone fracture. Central skull base appears intact. Orbits: No orbital wall fracture. Globes and intraorbital soft tissues appear normal. Sinuses: Small volume layering fluid in the right sphenoid sinus. Bubbly opacity in the bilateral nasal cavity with mild nasal septal deviation which is age indeterminate. But no nasal septal hematoma. Other paranasal sinuses are clear. Tympanic cavities and mastoids are clear. Soft tissues: Intubated, small volume fluid in the pharynx. Calcified carotid atherosclerosis greater on the right. Otherwise negative visible noncontrast deep soft tissue spaces of the face. Limited intracranial: Reported separately  today. IMPRESSION: No acute facial fracture identified. Retained secretions in the nasal cavity may correspond to epistaxis. And nasal septal deviation is age indeterminate but there is no nasal bone fracture. Electronically Signed   By: Genevie Ann M.D.   On: 30-Oct-2021 08:35    Review of Systems  Unable to perform ROS: Intubated  Blood pressure 129/73, pulse (!) 114, temperature 98.5 F (36.9 C), temperature source Oral, resp. rate (!) 22, height 5\' 11"  (1.803 m), weight 76.2 kg, SpO2 97 %. Physical Exam Constitutional: well-developed, well-nourished Skull: normocephalic, contusion L temporal Eyes: pupils equal, round, reactive to light, 27mm on R, sluggish, 12mm and brisk reactivity on L, moist conjunctiva Face/ENT: midface stable without deformity, poor  dentition, external inspection of ears and nose normal, hearing unable to be assessed  Oropharynx: normal oropharyngeal mucosa, no blood, intubated after arrival Neck: no thyromegaly, trachea midline, c-collar in place on arrival, unable to assess midline cervical tenderness to palpation, no C-spine stepoffs Chest: breath sounds equal bilaterally, normal  respiratory effort, no midline or lateral chest wall tenderness to palpation/deformity Abdomen: soft, NT, no bruising, no hepatosplenomegaly FAST: not performed Pelvis: stable GU: no blood at urethral meatus of penis, no scrotal masses or abnormality Back: no wounds, unable to assess T/L spine TTP, no T/L spine stepoffs Rectal: good tone, no blood Extremities: 2+  central pulses bilaterally, intact motor and sensation of bilateral UE and LE, 1+ peripheral edema MSK:  unable to assess gait/station, no clubbing/cyanosis of fingers/toes but diffuse mottling, normal ROM of all four extremities Skin: cool, dry, no rashes   CXR in TB: R sided infiltrate-suspect PNA Pelvis XR in TB: unremarakable   Procedures in TB: CVC by Dr. Grandville Silos, intubation by Dr. Jeanell Sparrow   Assessment/Plan: GLF Large R lung  mass with consolidation, effusion and lymphadenopathy  R rib FX 4-6 - no hemothorax or pneumothorax  CT abd shows Liver mass, Possible omental tumors, possible adrenal tumors and ascites. No acute injury  AF RVR  Rec admit to [facility]. I D/W Dr. Jeanell Sparrow. I also spoke with his wife.  Critical care 24min Zen[place]ia Jarred 2021/10/24, 8:43 AM

## 2021-11-22 NOTE — TOC CAGE-AID Note (Signed)
Transition of Care (TOC) - CAGE-AID Screening ? ? ?Patient Details  ?Name: Eric Hoffman ?MRN: [id number] ?Date of Birth: [birth year] ? ?Transition of Care (TOC) CM/SW Contact:    ?Tammara Massing C Tarpley-Carter, LCSWA ?Phone Number: ?2021-11-11, 9:11 AM ? ? ?Clinical Narrative: ?Pt participated in [place].  Pt stated he does smoke cigarettes, but does not drink ETOH.  Pt was offered resources, due to usage of substance.   ? ?Passenger transport manager, MSW, LCSW-A ?Pronouns:  She/Her/Hers ?[facility] of [facility] ?Clinical Social Worker ?Direct Number:  [phone] ?[email or web address]  ? ?CAGE-AID Screening: ?  ? ?Have You Ever Felt You Ought to Cut Down on Your Drinking or Drug Use?: No ?Have People Annoyed You By Critizing Your Drinking Or Drug Use?: No ?Have You Felt Bad Or Guilty About Your Drinking Or Drug Use?: No ?Have You Ever Had a Drink or Used Drugs First Thing In The Morning to Steady Your Nerves or to Get Rid of a Hangover?: No ?CAGE-AID Score: 0 ? ?Substance Abuse Education Offered: Yes ? ?Substance abuse interventions: Educational Materials ? ? ? ? ? ? ?

## 2021-11-22 NOTE — ED Notes (Signed)
Urology at bedside for foley access ?

## 2021-11-22 NOTE — ED Notes (Signed)
EMS Collar exchanged for [employer] ?

## 2021-11-22 NOTE — [facility] Notes (Signed)
Patient transported from ED to room 2M04 on the ventilator with no problems. ?

## 2021-11-22 DEATH — deceased

## 2022-08-19 IMAGING — CT CT HEAD W/O CM
4 series · 16 of 47 positions shown, 18 images · non-contrast
Comparison: Face and cervical spine CT today.

CLINICAL DATA: 80-year-old male status post fall. Level 1 trauma.



[Series 3: head wo · axial · 0.44mm/px · z∈[-117,+3]mm · 7 of 34 slices shown, 9 images]
[im 5/34  brain]
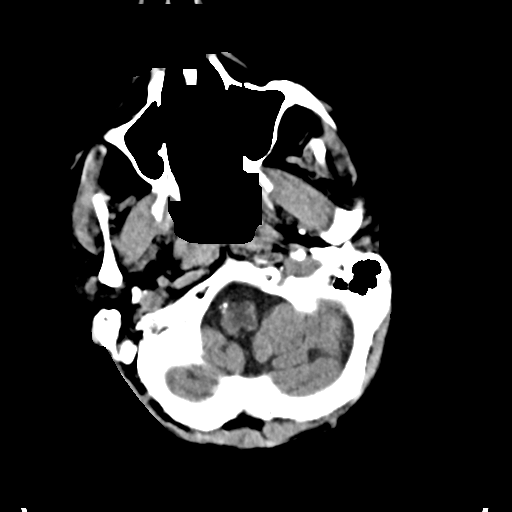
[im 5/34  bone]
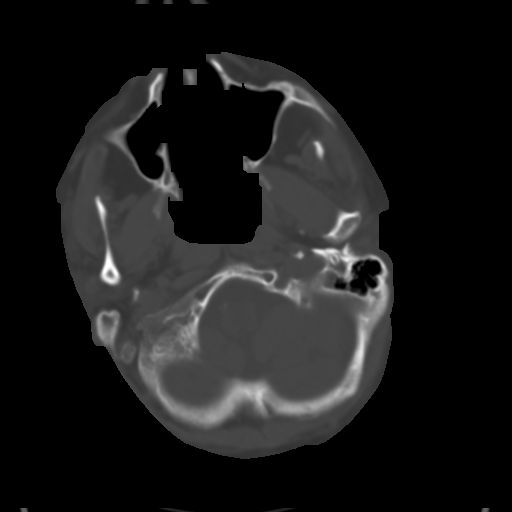
[im 9/34  brain]
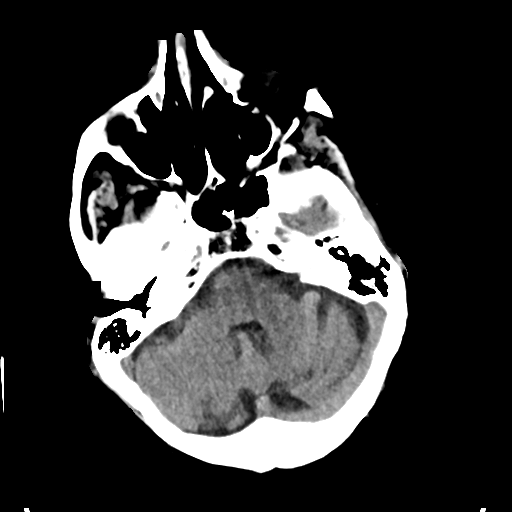
[im 13/34  brain]
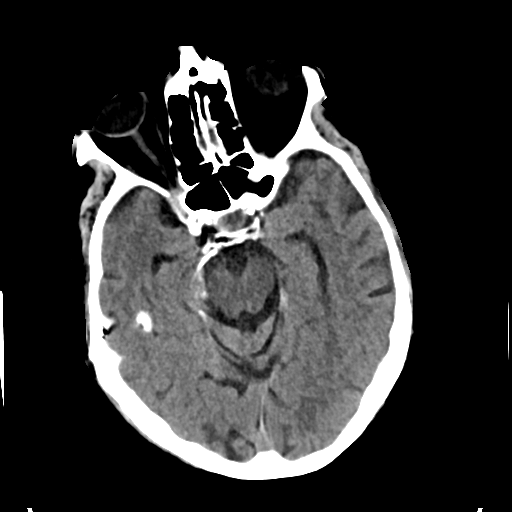
[im 17/34  brain]
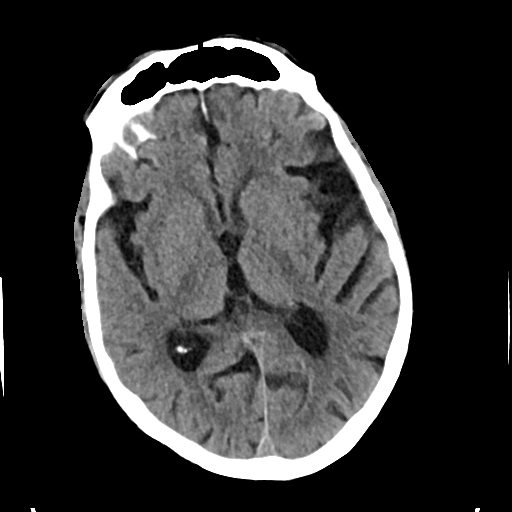
[im 21/34  brain]
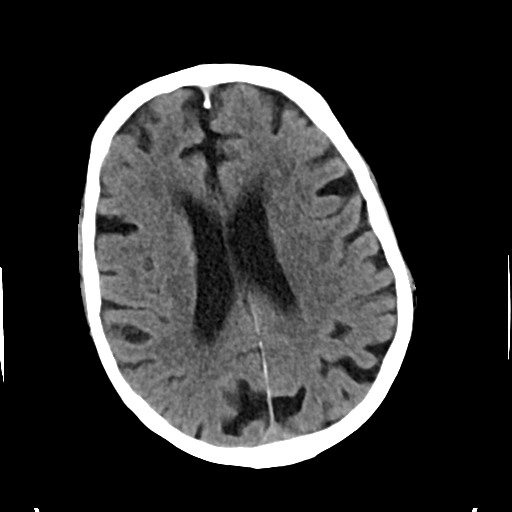
[im 21/34  bone]
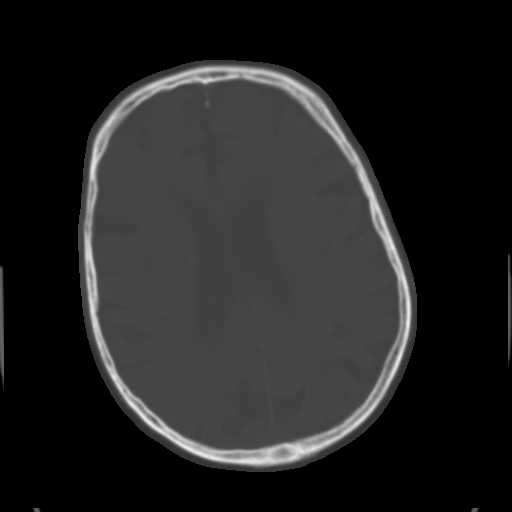
[im 25/34  brain]
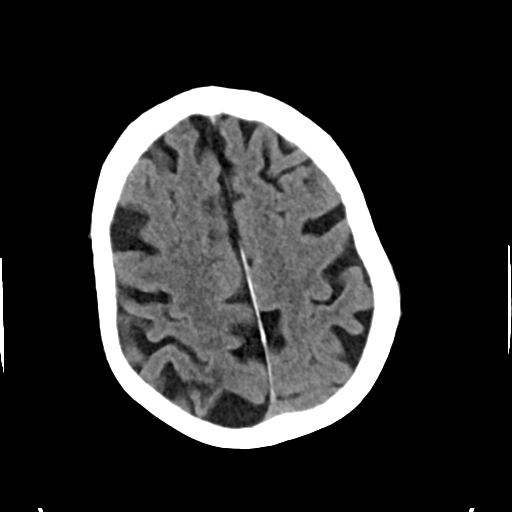
[im 29/34  brain]
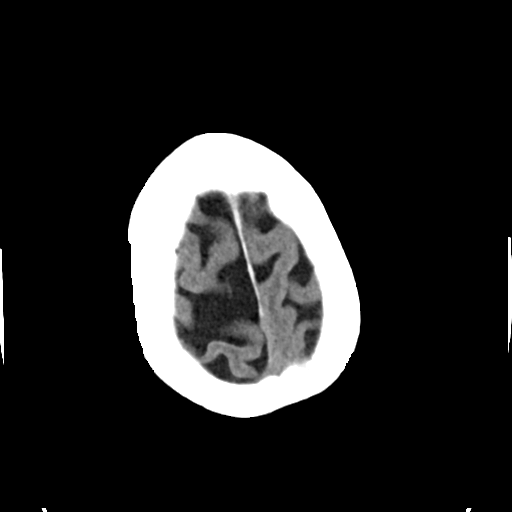

[Series 4: head bone · axial · 0.44mm/px · z∈[-121,-89]mm · 3 of 83 slices shown]
[im 9/83  bone]
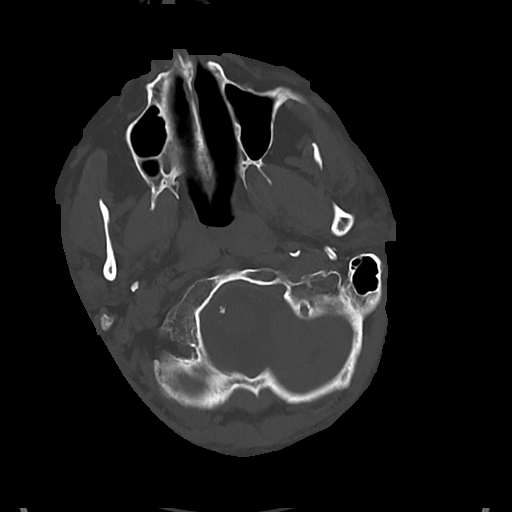
[im 17/83  bone]
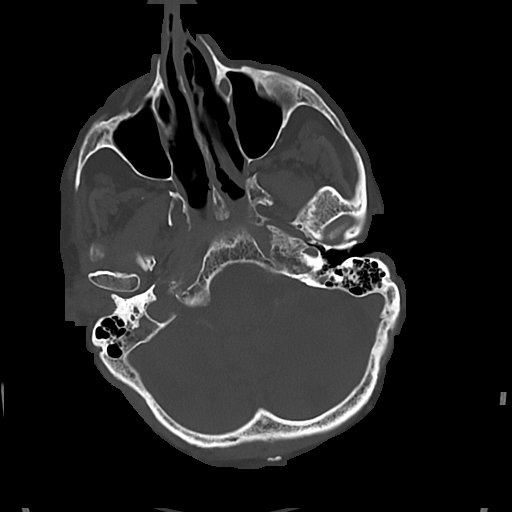
[im 25/83  bone]
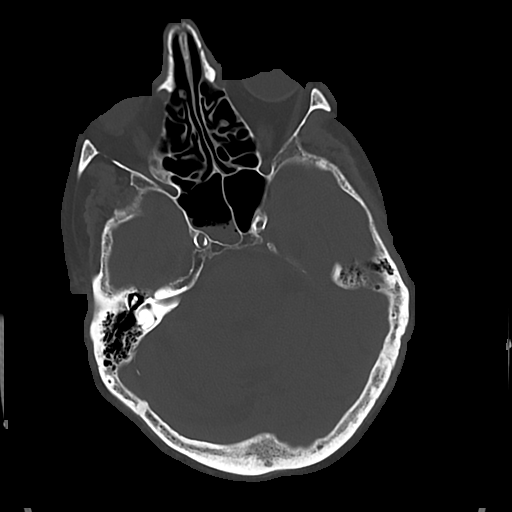

[Series 5: cor soft · coronal · 0.34mm/px · 3 of 76 slices shown]
[im 26/76  brain]
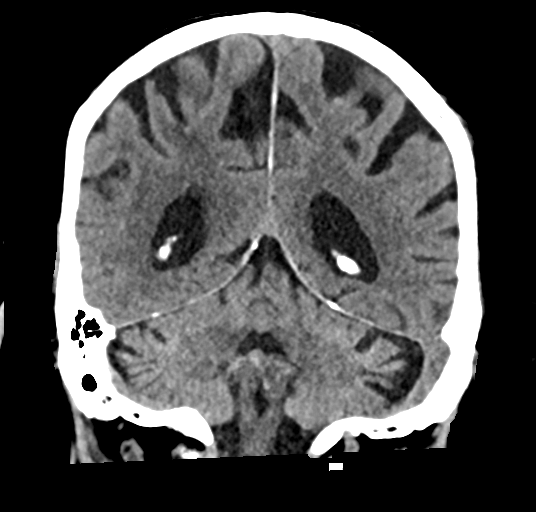
[im 34/76  brain]
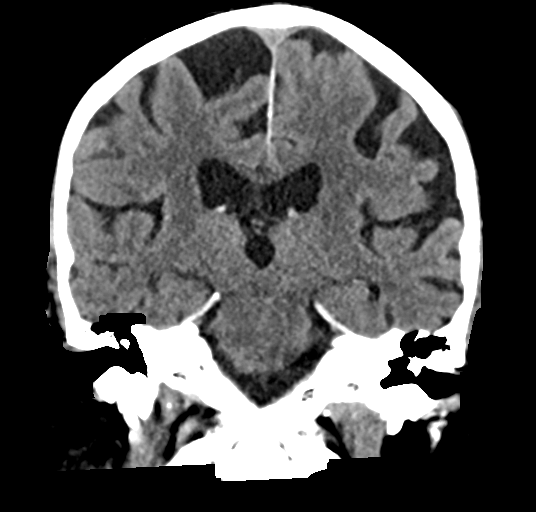
[im 42/76  brain]
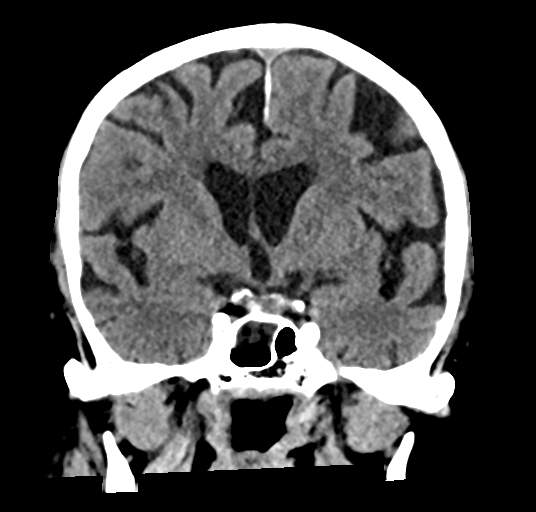

[Series 6: sag soft · sagittal · 0.34mm/px · 3 of 60 slices shown]
[im 20/60  brain]
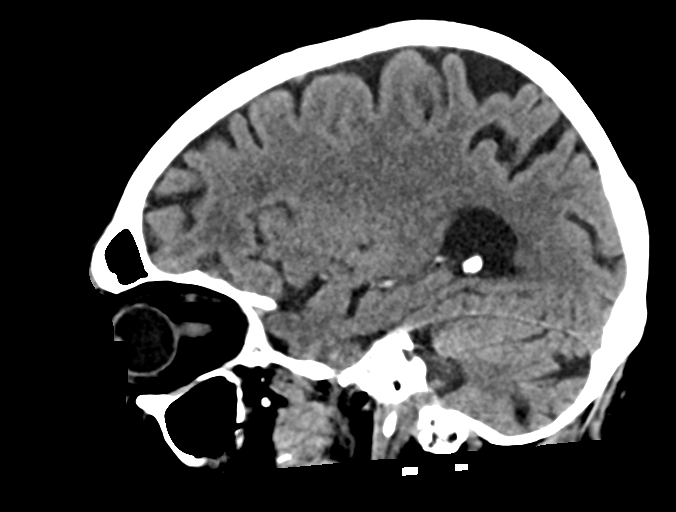
[im 30/60  brain]
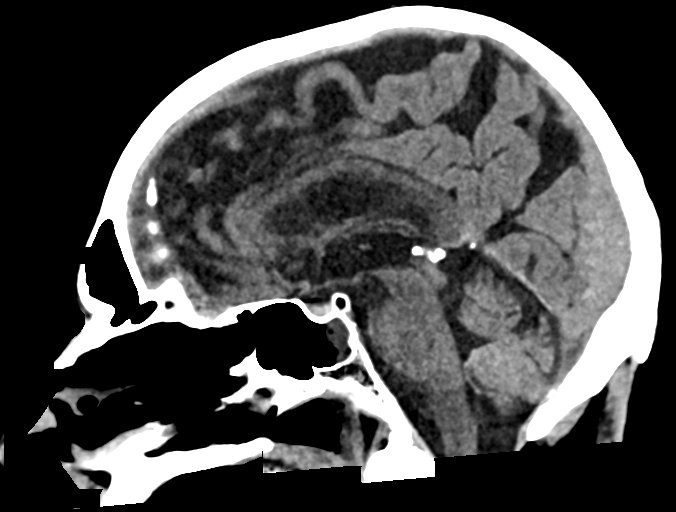
[im 40/60  brain]
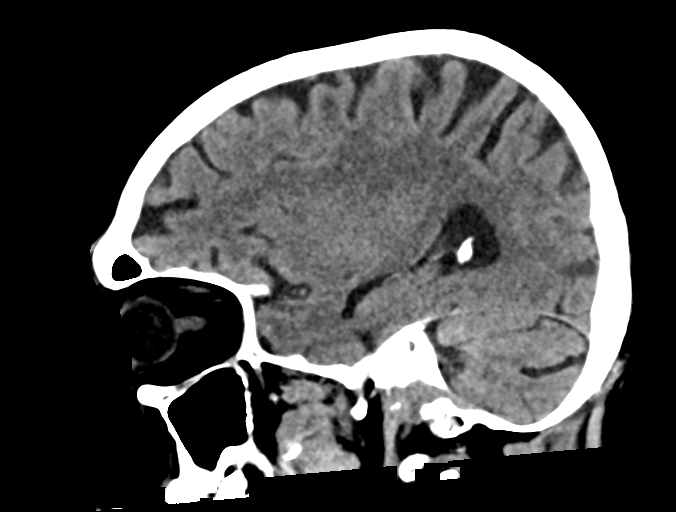

[16 of 47 positions shown; findings below may reference images not displayed]

FINDINGS: Brain: Incidental dural calcification along the falx. Chronic
appearing bilateral cerebellar infarcts. Chronic encephalomalacia in
the right occipital pole, right PCA territory. Small area of chronic
encephalomalacia also in the posterosuperior right parietal lobe
(series 3, image 25).

No midline shift, ventriculomegaly, mass effect, evidence of mass
lesion, intracranial hemorrhage or evidence of cortically based
acute infarction.

Vascular: Extensive Calcified atherosclerosis at the skull base.

Skull: No fracture identified.

Sinuses/Orbits: Low-density bubbly opacity in the right sphenoid
sinus. Other Visualized paranasal sinuses and mastoids are clear.

Other: Left posterior scalp hematoma or contusion on series 4, image
58. No scalp soft tissue gas. Underlying calvarium appears intact.
Other orbit and scalp soft tissues appear negative.
IMPRESSION: 1. Left posterior scalp hematoma or contusion without underlying
skull fracture.
2. No acute traumatic injury to the brain identified. Chronic
infarcts in the bilateral cerebellum, and right MCA and PCA
territories.
3. Mild right sphenoid sinus inflammation.
